# Patient Record
Sex: Female | Born: 1995 | Race: Black or African American | Hispanic: No | Marital: Single | Smoking: Never smoker
Health system: Southern US, Community
[De-identification: ages and names within clinical notes are randomized; demographics above are authoritative.]

## PROBLEM LIST (undated history)

## (undated) DIAGNOSIS — Z789 Other specified health status: Secondary | ICD-10-CM

---

## 2015-10-31 ENCOUNTER — Encounter (HOSPITAL_COMMUNITY): Payer: Self-pay | Admitting: Emergency Medicine

## 2015-10-31 ENCOUNTER — Emergency Department (HOSPITAL_COMMUNITY)
Admission: EM | Admit: 2015-10-31 | Discharge: 2015-11-01 | Disposition: A | Discharge status: Home / Self Care | Payer: Medicaid Other | Attending: Emergency Medicine | Admitting: Emergency Medicine

## 2015-10-31 ENCOUNTER — Emergency Department (HOSPITAL_COMMUNITY): Payer: Medicaid Other

## 2015-10-31 DIAGNOSIS — Y998 Other external cause status: Secondary | ICD-10-CM | POA: Insufficient documentation

## 2015-10-31 DIAGNOSIS — Y9389 Activity, other specified: Secondary | ICD-10-CM | POA: Diagnosis not present

## 2015-10-31 DIAGNOSIS — Y92410 Unspecified street and highway as the place of occurrence of the external cause: Secondary | ICD-10-CM | POA: Insufficient documentation

## 2015-10-31 DIAGNOSIS — F17200 Nicotine dependence, unspecified, uncomplicated: Secondary | ICD-10-CM | POA: Diagnosis not present

## 2015-10-31 DIAGNOSIS — S29001A Unspecified injury of muscle and tendon of front wall of thorax, initial encounter: Secondary | ICD-10-CM | POA: Diagnosis not present

## 2015-10-31 MED ORDER — IBUPROFEN 600 MG PO TABS: 600.0000 mg | ORAL_TABLET | Freq: Four times a day (QID) | ORAL | Status: DC | PRN

## 2015-10-31 MED ORDER — IBUPROFEN 400 MG PO TABS
600.0000 mg | ORAL_TABLET | Freq: Once | ORAL | Status: AC
  Administered 2015-11-01: 600 mg via ORAL
  Filled 2015-10-31: qty 1
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
## 2017-04-14 ENCOUNTER — Encounter (HOSPITAL_COMMUNITY): Payer: Self-pay | Admitting: Emergency Medicine

## 2017-04-14 DIAGNOSIS — S0990XA Unspecified injury of head, initial encounter: Secondary | ICD-10-CM | POA: Diagnosis present

## 2017-04-14 DIAGNOSIS — Y92410 Unspecified street and highway as the place of occurrence of the external cause: Secondary | ICD-10-CM | POA: Diagnosis not present

## 2017-04-14 DIAGNOSIS — Z87891 Personal history of nicotine dependence: Secondary | ICD-10-CM | POA: Insufficient documentation

## 2017-04-14 DIAGNOSIS — Y939 Activity, unspecified: Secondary | ICD-10-CM | POA: Diagnosis not present

## 2017-04-14 DIAGNOSIS — Y999 Unspecified external cause status: Secondary | ICD-10-CM | POA: Diagnosis not present
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
## 2017-04-15 ENCOUNTER — Emergency Department (HOSPITAL_COMMUNITY)
Admission: EM | Admit: 2017-04-15 | Discharge: 2017-04-15 | Disposition: A | Discharge status: Home / Self Care | Payer: No Typology Code available for payment source | Attending: Emergency Medicine | Admitting: Emergency Medicine

## 2017-04-15 MED ORDER — IBUPROFEN 600 MG PO TABS: 600.0000 mg | ORAL_TABLET | Freq: Four times a day (QID) | ORAL | 0 refills | Status: DC | PRN
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
## 2017-04-15 NOTE — Discharge Instructions (Signed)
Return if any problems.
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
## 2017-04-15 NOTE — ED Notes (Signed)
Pt verbalized understanding of d/c instructions and has no further questions. Pt is stable, A&Ox4, VSS.  

## 2017-11-03 ENCOUNTER — Encounter: Payer: Medicaid Other | Admitting: Obstetrics and Gynecology

## 2017-11-12 ENCOUNTER — Ambulatory Visit (INDEPENDENT_AMBULATORY_CARE_PROVIDER_SITE_OTHER): Payer: Medicaid Other | Admitting: Certified Nurse Midwife

## 2017-11-12 ENCOUNTER — Encounter: Payer: Self-pay | Admitting: Certified Nurse Midwife

## 2017-11-12 ENCOUNTER — Other Ambulatory Visit (HOSPITAL_COMMUNITY)
Admission: RE | Admit: 2017-11-12 | Discharge: 2017-11-12 | Disposition: A | Discharge status: Home / Self Care | Payer: Medicaid Other | Source: Ambulatory Visit | Attending: Certified Nurse Midwife | Admitting: Certified Nurse Midwife

## 2017-11-12 VITALS — BP 126/88 | HR 69 | Wt 222.8 lb

## 2017-11-12 DIAGNOSIS — Z3400 Encounter for supervision of normal first pregnancy, unspecified trimester: Secondary | ICD-10-CM | POA: Insufficient documentation

## 2017-11-12 DIAGNOSIS — N870 Mild cervical dysplasia: Secondary | ICD-10-CM | POA: Diagnosis not present

## 2017-11-12 DIAGNOSIS — Z3402 Encounter for supervision of normal first pregnancy, second trimester: Secondary | ICD-10-CM | POA: Diagnosis not present

## 2017-11-12 DIAGNOSIS — O219 Vomiting of pregnancy, unspecified: Secondary | ICD-10-CM

## 2017-11-12 MED ORDER — PRENATE PIXIE 10-0.6-0.4-200 MG PO CAPS: 1.0000 | ORAL_CAPSULE | Freq: Every day | ORAL | 12 refills | Status: DC

## 2017-11-12 MED ORDER — DOXYLAMINE-PYRIDOXINE 10-10 MG PO TBEC: DELAYED_RELEASE_TABLET | ORAL | 4 refills | Status: DC

## 2017-11-12 NOTE — Addendum Note (Signed)
Addended by: Orvilla CornwallENNEY, Emmit Oriley A on: 11/12/2017 11:10 AM   Modules accepted: Orders

## 2017-11-12 NOTE — Progress Notes (Signed)
Patient is in the office for initial ob visit. This was an unplanned pregnancy, fob is here and involved but they are currently not in a relationship.

## 2017-11-12 NOTE — Progress Notes (Signed)
Subjective:   Debra Reilly is a 21 y.o. G1P0 at 5937w2d by LMP being seen today for her first obstetrical visit.  Her obstetrical history is significant for obesity. Patient does intend to breast feed. Pregnancy history fully reviewed.  Reports N&V.  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   Patient reports no complaints.  HISTORY: Obstetric History   G1   P0   T0   P0   A0   L0    SAB0   TAB0   Ectopic0   Multiple0   Live Births0     # Outcome Date GA Lbr Len/2nd Weight Sex Delivery Anes PTL Lv  1 Current              History reviewed. No pertinent past medical history. History reviewed. No pertinent surgical history. Family History  Problem Relation Age of Onset  . Diabetes Maternal Aunt   . Hypertension Paternal Uncle    Social History   Tobacco Use  . Smoking status: Former Games developermoker  . Smokeless tobacco: Never Used  Substance Use Topics  . Alcohol use: No    Frequency: Never  . Drug use: No   No Known Allergies Current Outpatient Medications on File Prior to Visit  Medication Sig Dispense Refill  . prenatal vitamin w/FE, FA (PRENATAL 1 + 1) 27-1 MG TABS tablet Take 1 tablet by mouth daily at 12 noon.     No current facility-administered medications on file prior to visit.      Exam   Vitals:   11/12/17 1015  BP: 126/88  Pulse: 69  Weight: 222 lb 12.8 oz (101.1 kg)   Fetal Heart Rate (bpm): 165; doppler  Uterus:     Pelvic Exam: Perineum: no hemorrhoids, normal perineum   Vulva: normal external genitalia, no lesions   Vagina:  normal mucosa, normal discharge   Cervix: no lesions and normal, pap smear done.    Adnexa: normal adnexa and no mass, fullness, tenderness   Bony Pelvis: average  System: General: well-developed, well-nourished female in no acute distress   Breast:  normal appearance, no masses or tenderness   Skin: normal coloration and turgor, no rashes   Neurologic: oriented, normal, negative, normal mood   Extremities: normal strength, tone, and  muscle mass, ROM of all joints is normal   HEENT PERRLA, extraocular movement intact and sclera clear, anicteric   Mouth/Teeth mucous membranes moist, pharynx normal without lesions and dental hygiene good   Neck supple and no masses   Cardiovascular: regular rate and rhythm   Respiratory:  no respiratory distress, normal breath sounds   Abdomen: soft, non-tender; bowel sounds normal; no masses,  no organomegaly     Assessment:   Pregnancy: G1P0 Patient Active Problem List   Diagnosis Date Noted  . Supervision of normal first pregnancy, antepartum 11/12/2017     Plan:  1. Supervision of normal first pregnancy, antepartum      - Obstetric Panel, Including HIV - Hemoglobinopathy evaluation - Culture, OB Urine - Cervicovaginal ancillary only - Cytology - PAP - Flu Vaccine QUAD 36+ mos IM (Fluarix, Quad PF) - Inheritest Core(CF97,SMA,FraX) - Prenat-FeAsp-Meth-FA-DHA w/o A (PRENATE PIXIE) 10-0.6-0.4-200 MG CAPS; Take 1 tablet by mouth daily.  Dispense: 30 capsule; Refill: 12 - MaterniT21 PLUS Core+SCA - US MFM OB DETAIL +14 WK; Future  2. Nausea/vomiting in pregnancy     - Doxylamine-Pyridoxine (DICLEGIS) 10-10 MG TBEC; Take 1 tablet with breakfast and lunch.  Take 2 tablets at bedtime.  Dispense: 100 tablet; Refill: 4  Initial labs drawn. Continue prenatal vitamins. Genetic Screening discussed, NIPS: ordered. Ultrasound discussed; fetal anatomic survey: ordered. Problem list reviewed and updated. The nature of [facility] with multiple MDs and other Advanced Practice Providers was explained to patient; also emphasized that residents, students are part of our team. Routine obstetric precautions reviewed. Return in about 4 weeks (around 12/10/2017) for ROB.     Orvilla Cornwallachelle Wen Munford, CNM [facility]

## 2017-11-13 LAB — CERVICOVAGINAL ANCILLARY ONLY
Bacterial vaginitis: NEGATIVE
CANDIDA VAGINITIS: NEGATIVE
Chlamydia: NEGATIVE
Neisseria Gonorrhea: NEGATIVE
TRICH (WINDOWPATH): NEGATIVE

## 2017-11-14 LAB — OBSTETRIC PANEL, INCLUDING HIV
ANTIBODY SCREEN: NEGATIVE
BASOS ABS: 0 10*3/uL (ref 0.0–0.2)
BASOS: 0 %
EOS (ABSOLUTE): 0 10*3/uL (ref 0.0–0.4)
EOS: 1 %
HEMATOCRIT: 35.9 % (ref 34.0–46.6)
HEP B S AG: NEGATIVE
HIV SCREEN 4TH GENERATION: NONREACTIVE
Hemoglobin: 11.5 g/dL (ref 11.1–15.9)
Immature Grans (Abs): 0 10*3/uL (ref 0.0–0.1)
Immature Granulocytes: 0 %
Lymphocytes Absolute: 2 10*3/uL (ref 0.7–3.1)
Lymphs: 24 %
MCH: 29.7 pg (ref 26.6–33.0)
MCHC: 32 g/dL (ref 31.5–35.7)
MCV: 93 fL (ref 79–97)
Monocytes Absolute: 0.5 10*3/uL (ref 0.1–0.9)
Monocytes: 6 %
NEUTROS ABS: 5.7 10*3/uL (ref 1.4–7.0)
Neutrophils: 69 %
Platelets: 265 10*3/uL (ref 150–379)
RBC: 3.87 x10E6/uL (ref 3.77–5.28)
RDW: 15.2 % (ref 12.3–15.4)
RH TYPE: POSITIVE
RPR: NONREACTIVE
Rubella Antibodies, IGG: 1.76 index (ref >0.99)
WBC: 8.3 10*3/uL (ref 3.4–10.8)

## 2017-11-14 LAB — HEMOGLOBIN A1C
Est. average glucose Bld gHb Est-mCnc: 111 mg/dL
HEMOGLOBIN A1C: 5.5 % (ref 4.8–5.6)

## 2017-11-14 LAB — URINE CULTURE, OB REFLEX

## 2017-11-14 LAB — HEMOGLOBINOPATHY EVALUATION
HEMOGLOBIN A2 QUANTITATION: 2.5 % (ref 1.8–3.2)
HGB A: 97.5 % (ref 96.4–98.8)
HGB C: 0 %
HGB S: 0 %
HGB VARIANT: 0 %
Hemoglobin F Quantitation: 0 % (ref 0.0–2.0)

## 2017-11-14 LAB — CULTURE, OB URINE

## 2017-11-17 LAB — MATERNIT21 PLUS CORE+SCA
CHROMOSOME 13: NEGATIVE
CHROMOSOME 21: NEGATIVE
Chromosome 18: NEGATIVE
Y CHROMOSOME: NOT DETECTED

## 2017-11-19 LAB — CYTOLOGY - PAP

## 2017-11-20 ENCOUNTER — Encounter: Payer: Self-pay | Admitting: Family Medicine

## 2017-11-20 DIAGNOSIS — Z3400 Encounter for supervision of normal first pregnancy, unspecified trimester: Secondary | ICD-10-CM

## 2017-11-20 DIAGNOSIS — E669 Obesity, unspecified: Secondary | ICD-10-CM | POA: Insufficient documentation

## 2017-11-20 DIAGNOSIS — R87612 Low grade squamous intraepithelial lesion on cytologic smear of cervix (LGSIL): Secondary | ICD-10-CM | POA: Insufficient documentation

## 2017-11-20 DIAGNOSIS — O99210 Obesity complicating pregnancy, unspecified trimester: Secondary | ICD-10-CM | POA: Insufficient documentation

## 2017-11-20 DIAGNOSIS — O3440 Maternal care for other abnormalities of cervix, unspecified trimester: Secondary | ICD-10-CM

## 2017-11-20 DIAGNOSIS — R87619 Unspecified abnormal cytological findings in specimens from cervix uteri: Secondary | ICD-10-CM

## 2017-11-20 NOTE — Progress Notes (Signed)
Reviewed prenatal chart. Patient needs repeat pap in 1 year

## 2017-11-25 LAB — INHERITEST CORE(CF97,SMA,FRAX)

## 2017-11-25 NOTE — L&D Delivery Note (Signed)
Patient is a 22 y.o. now G1P1 s/p NSVD at 646w6d, who was admitted for PROM @2015  on 05/16/18.  She progressed with augmentation (pitocin) to complete and pushed 35 minutes to deliver.  Cord clamping delayed by several minutes then clamped by CNM and cut by FOB.  Placenta intact and spontaneous, bleeding minimal.  Left labial laceration identified- not repaired, hemostatic.  Mom and baby stable prior to transfer to postpartum. She plans on breastfeeding. She is unsure of method for birth control.  Delivery Note At 5:50 AM a viable and healthy female was delivered via Vaginal, Spontaneous (Presentation: LOA).  APGAR: 8, 9; weight pending.   Placenta intact and spontaneous, bleeding minimal. 3V Cord:  with n complications.   Anesthesia: Epidural Episiotomy: None Lacerations: Labial Suture Repair: None Est. Blood Loss (mL): 50  Mom to postpartum.  Baby to Couplet care / Skin to Skin.  Sharyon CableVeronica C Terie Lear CNM 05/17/2018, 6:16 AM

## 2017-12-01 ENCOUNTER — Other Ambulatory Visit: Payer: Self-pay | Admitting: Certified Nurse Midwife

## 2017-12-01 DIAGNOSIS — Z3400 Encounter for supervision of normal first pregnancy, unspecified trimester: Secondary | ICD-10-CM

## 2017-12-10 ENCOUNTER — Ambulatory Visit (INDEPENDENT_AMBULATORY_CARE_PROVIDER_SITE_OTHER): Payer: Medicaid Other | Admitting: Certified Nurse Midwife

## 2017-12-10 ENCOUNTER — Encounter: Payer: Self-pay | Admitting: Certified Nurse Midwife

## 2017-12-10 VITALS — BP 117/71 | HR 72 | Wt 225.6 lb

## 2017-12-10 DIAGNOSIS — E669 Obesity, unspecified: Secondary | ICD-10-CM

## 2017-12-10 DIAGNOSIS — R87612 Low grade squamous intraepithelial lesion on cytologic smear of cervix (LGSIL): Secondary | ICD-10-CM

## 2017-12-10 DIAGNOSIS — Z3402 Encounter for supervision of normal first pregnancy, second trimester: Secondary | ICD-10-CM

## 2017-12-10 DIAGNOSIS — Z3400 Encounter for supervision of normal first pregnancy, unspecified trimester: Secondary | ICD-10-CM

## 2017-12-10 DIAGNOSIS — O99212 Obesity complicating pregnancy, second trimester: Secondary | ICD-10-CM

## 2017-12-10 DIAGNOSIS — O99210 Obesity complicating pregnancy, unspecified trimester: Secondary | ICD-10-CM

## 2017-12-10 NOTE — Progress Notes (Signed)
   PRENATAL VISIT NOTE  Subjective:  Debra Reilly is a 22 y.o. G1P0 at 6456w2d being seen today for ongoing prenatal care.  She is currently monitored for the following issues for this low-risk pregnancy and has Supervision of normal first pregnancy, antepartum; Obesity affecting pregnancy, antepartum; Obesity (BMI 30.0-34.9); Abnormal cervical Papanicolaou smear affecting pregnancy, antepartum; and LGSIL on Pap smear of cervix on their problem list.  Patient reports no complaints.  Contractions: Not present. Vag. Bleeding: None.   . Denies leaking of fluid.   The following portions of the patient's history were reviewed and updated as appropriate: allergies, current medications, past family history, past medical history, past social history, past surgical history and problem list. Problem list updated.  Objective:   Vitals:   12/10/17 1014  BP: 117/71  Pulse: 72  Weight: 225 lb 9.6 oz (102.3 kg)    Fetal Status: Fetal Heart Rate (bpm): 160; doppler         General:  Alert, oriented and cooperative. Patient is in no acute distress.  Skin: Skin is warm and dry. No rash noted.   Cardiovascular: Normal heart rate noted  Respiratory: Normal respiratory effort, no problems with respiration noted  Abdomen: Soft, gravid, appropriate for gestational age.  Pain/Pressure: Absent     Pelvic: Cervical exam deferred        Extremities: Normal range of motion.     Mental Status:  Normal mood and affect. Normal behavior. Normal judgment and thought content.   Assessment and Plan:  Pregnancy: G1P0 at 556w2d  1. Supervision of normal first pregnancy, antepartum     Doing well.  Prenatal class lists given.  - AFP, Serum, Open Spina Bifida  2. Obesity affecting pregnancy, antepartum     Has lost 4 lbs this pregnancy  3. LGSIL on Pap smear of cervix     Colposcopy PP  Preterm labor symptoms and general obstetric precautions including but not limited to vaginal bleeding, contractions, leaking of  fluid and fetal movement were reviewed in detail with the patient. Please refer to After Visit Summary for other counseling recommendations.  Return in about 4 weeks (around 01/07/2018) for ROB.   Roe Coombsachelle A Afton Lavalle, CNM

## 2017-12-10 NOTE — Progress Notes (Signed)
Pt denies concerns at this time. 

## 2017-12-13 LAB — AFP, SERUM, OPEN SPINA BIFIDA
AFP MOM: 0.92
AFP VALUE AFPOSL: 26.3 ng/mL
Gest. Age on Collection Date: 16.3 weeks
MATERNAL AGE AT EDD: 22 a
OSBR Risk 1 IN: 10000
TEST RESULTS AFP: NEGATIVE
Weight: 226 [lb_av]

## 2017-12-17 ENCOUNTER — Other Ambulatory Visit: Payer: Self-pay | Admitting: Certified Nurse Midwife

## 2017-12-17 DIAGNOSIS — Z3400 Encounter for supervision of normal first pregnancy, unspecified trimester: Secondary | ICD-10-CM

## 2017-12-19 ENCOUNTER — Encounter (HOSPITAL_COMMUNITY): Payer: Self-pay | Admitting: Certified Nurse Midwife

## 2017-12-29 ENCOUNTER — Ambulatory Visit (HOSPITAL_COMMUNITY)
Admission: RE | Admit: 2017-12-29 | Discharge: 2017-12-29 | Disposition: A | Discharge status: Home / Self Care | Payer: Medicaid Other | Source: Ambulatory Visit | Attending: Certified Nurse Midwife | Admitting: Certified Nurse Midwife

## 2017-12-29 DIAGNOSIS — O99212 Obesity complicating pregnancy, second trimester: Secondary | ICD-10-CM | POA: Diagnosis not present

## 2017-12-29 DIAGNOSIS — Z3689 Encounter for other specified antenatal screening: Secondary | ICD-10-CM | POA: Diagnosis present

## 2017-12-29 DIAGNOSIS — Z3400 Encounter for supervision of normal first pregnancy, unspecified trimester: Secondary | ICD-10-CM

## 2017-12-29 DIAGNOSIS — Z3A19 19 weeks gestation of pregnancy: Secondary | ICD-10-CM | POA: Diagnosis not present

## 2017-12-30 ENCOUNTER — Other Ambulatory Visit: Payer: Self-pay | Admitting: Certified Nurse Midwife

## 2017-12-30 DIAGNOSIS — Z3400 Encounter for supervision of normal first pregnancy, unspecified trimester: Secondary | ICD-10-CM

## 2018-01-07 ENCOUNTER — Encounter: Payer: Medicaid Other | Admitting: Certified Nurse Midwife

## 2018-01-12 ENCOUNTER — Ambulatory Visit (INDEPENDENT_AMBULATORY_CARE_PROVIDER_SITE_OTHER): Payer: Medicaid Other | Admitting: Certified Nurse Midwife

## 2018-01-12 ENCOUNTER — Encounter: Payer: Self-pay | Admitting: Certified Nurse Midwife

## 2018-01-12 DIAGNOSIS — Z3402 Encounter for supervision of normal first pregnancy, second trimester: Secondary | ICD-10-CM

## 2018-01-12 DIAGNOSIS — Z3400 Encounter for supervision of normal first pregnancy, unspecified trimester: Secondary | ICD-10-CM

## 2018-01-12 NOTE — Progress Notes (Signed)
   PRENATAL VISIT NOTE  Subjective:  Debra Reilly is a 22 y.o. G1P0 at 5927w0d being seen today for ongoing prenatal care.  She is currently monitored for the following issues for this low-risk pregnancy and has Supervision of normal first pregnancy, antepartum; Obesity affecting pregnancy, antepartum; Obesity (BMI 30.0-34.9); Abnormal cervical Papanicolaou smear affecting pregnancy, antepartum; and LGSIL on Pap smear of cervix on their problem list.  Patient reports no bleeding, no contractions, no cramping and no leaking.  Contractions: Not present. Vag. Bleeding: None.   . Denies leaking of fluid.   The following portions of the patient's history were reviewed and updated as appropriate: allergies, current medications, past family history, past medical history, past social history, past surgical history and problem list. Problem list updated.  Objective:   Vitals:   01/12/18 1017  BP: 125/86  Pulse: 92  Weight: 224 lb (101.6 kg)    Fetal Status: Fetal Heart Rate (bpm): 146-152; doppler Fundal Height: 22 cm       General:  Alert, oriented and cooperative. Patient is in no acute distress.  Skin: Skin is warm and dry. No rash noted.   Cardiovascular: Normal heart rate noted  Respiratory: Normal respiratory effort, no problems with respiration noted  Abdomen: Soft, gravid, appropriate for gestational age.  Pain/Pressure: Present     Pelvic: Cervical exam deferred        Extremities: Normal range of motion.  Edema: None  Mental Status:  Normal mood and affect. Normal behavior. Normal judgment and thought content.   Assessment and Plan:  Pregnancy: G1P0 at 1227w0d  1. Supervision of normal first pregnancy, antepartum     Doing well.  Has lost 6 lbs this pregnancy, will consider f/u growth US around start of 3rd trimester.   Preterm labor symptoms and general obstetric precautions including but not limited to vaginal bleeding, contractions, leaking of fluid and fetal movement were  reviewed in detail with the patient. Please refer to After Visit Summary for other counseling recommendations.  Return in about 4 weeks (around 02/09/2018) for ROB.   Roe Coombsachelle A Iktan Aikman, CNM

## 2018-01-22 ENCOUNTER — Ambulatory Visit (INDEPENDENT_AMBULATORY_CARE_PROVIDER_SITE_OTHER): Payer: Medicaid Other | Admitting: Certified Nurse Midwife

## 2018-01-22 ENCOUNTER — Encounter: Payer: Self-pay | Admitting: Certified Nurse Midwife

## 2018-01-22 VITALS — BP 114/76 | HR 81 | Wt 230.4 lb

## 2018-01-22 DIAGNOSIS — Z3400 Encounter for supervision of normal first pregnancy, unspecified trimester: Secondary | ICD-10-CM

## 2018-01-22 DIAGNOSIS — E669 Obesity, unspecified: Secondary | ICD-10-CM

## 2018-01-22 DIAGNOSIS — O99210 Obesity complicating pregnancy, unspecified trimester: Secondary | ICD-10-CM

## 2018-01-22 DIAGNOSIS — Z3402 Encounter for supervision of normal first pregnancy, second trimester: Secondary | ICD-10-CM

## 2018-01-22 DIAGNOSIS — O99212 Obesity complicating pregnancy, second trimester: Secondary | ICD-10-CM

## 2018-01-22 NOTE — Progress Notes (Signed)
   PRENATAL VISIT NOTE  Subjective:  Debra Reilly is a 22 y.o. G1P0 at 7557w3d being seen today for ongoing prenatal care.  She is currently monitored for the following issues for this low-risk pregnancy and has Supervision of normal first pregnancy, antepartum; Obesity affecting pregnancy, antepartum; Obesity (BMI 30.0-34.9); Abnormal cervical Papanicolaou smear affecting pregnancy, antepartum; and LGSIL on Pap smear of cervix on their problem list.  Patient reports backache, no bleeding, no contractions, no cramping, no leaking and lower left hip pain with movement: round ligament type pain.  Contractions: Not present. Vag. Bleeding: None.  Movement: Present. Denies leaking of fluid.   The following portions of the patient's history were reviewed and updated as appropriate: allergies, current medications, past family history, past medical history, past social history, past surgical history and problem list. Problem list updated.  Objective:   Vitals:   01/22/18 1014  BP: 114/76  Pulse: 81  Weight: 230 lb 6.4 oz (104.5 kg)    Fetal Status: Fetal Heart Rate (bpm): 150; doppler Fundal Height: 22 cm Movement: Present     General:  Alert, oriented and cooperative. Patient is in no acute distress.  Skin: Skin is warm and dry. No rash noted.   Cardiovascular: Normal heart rate noted  Respiratory: Normal respiratory effort, no problems with respiration noted  Abdomen: Soft, gravid, appropriate for gestational age.  Pain/Pressure: Absent     Pelvic: Cervical exam deferred        Extremities: Normal range of motion.  Edema: None  Mental Status:  Normal mood and affect. Normal behavior. Normal judgment and thought content.   Assessment and Plan:  Pregnancy: G1P0 at 3557w3d  1. Supervision of normal first pregnancy, antepartum     Round ligament pain of pregnancy.  Homeopathic remedies discussed.  Declines maternity support belt.   2. Obesity affecting pregnancy, antepartum     6 lb weight  gain this pregnancy  3. Obesity (BMI 30.0-34.9)      Preterm labor symptoms and general obstetric precautions including but not limited to vaginal bleeding, contractions, leaking of fluid and fetal movement were reviewed in detail with the patient. Please refer to After Visit Summary for other counseling recommendations.  Return in about 4 weeks (around 02/19/2018) for ROB, 2 hr OGTT.   Roe Coombsachelle A Denney, CNM

## 2018-01-22 NOTE — Progress Notes (Signed)
Pt complains of having a sharp pain lower left abdominal area. She has not taken anything for the pain.

## 2018-01-22 NOTE — Patient Instructions (Addendum)
Glucose Tolerance Test During Pregnancy The glucose tolerance test (GTT) is a blood test used to determine if you have developed a type of diabetes during pregnancy (gestational diabetes). This is when your body does not properly process sugar (glucose) in the food you eat, resulting in high blood glucose levels. Typically, a GTT is done after you have had a 1-hour glucose test with results that indicate you possibly have gestational diabetes. It may also be done if:  You have a history of giving birth to very large babies or have experienced repeated fetal loss (stillbirth).  You have signs and symptoms of diabetes, such as: ? Changes in your vision. ? Tingling or numbness in your hands or feet. ? Changes in hunger, thirst, and urination not otherwise explained by your pregnancy.  The GTT lasts about 3 hours. You will be given a sugar-water solution to drink at the beginning of the test. You will have blood drawn before you drink the solution and then again 1, 2, and 3 hours after you drink it. You will not be allowed to eat or drink anything else during the test. You must remain at the testing location to make sure that your blood is drawn on time. You should also avoid exercising during the test, because exercise can alter test results. How do I prepare for this test? Eat normally for 3 days prior to the GTT test, including having plenty of carbohydrate-rich foods. Do not eat or drink anything except water during the final 12 hours before the test. In addition, your health care provider may ask you to stop taking certain medicines before the test. What do the results mean? It is your responsibility to obtain your test results. Ask the lab or department performing the test when and how you will get your results. Contact your health care provider to discuss any questions you have about your results. Range of Normal Values Ranges for normal values may vary among different labs and hospitals. You  should always check with your health care provider after having lab work or other tests done to discuss whether your values are considered within normal limits. Normal levels of blood glucose are as follows:  Fasting: less than 105 mg/dL.  1 hour after drinking the solution: less than 190 mg/dL.  2 hours after drinking the solution: less than 165 mg/dL.  3 hours after drinking the solution: less than 145 mg/dL.  Some substances can interfere with GTT results. These may include:  Blood pressure and heart failure medicines, including beta blockers, furosemide, and thiazides.  Anti-inflammatory medicines, including aspirin.  Nicotine.  Some psychiatric medicines.  Meaning of Results Outside Normal Value Ranges GTT test results that are above normal values may indicate a number of health problems, such as:  Gestational diabetes.  Acute stress response.  Cushing syndrome.  Tumors such as pheochromocytoma or glucagonoma.  Long-term kidney problems.  Pancreatitis.  Hyperthyroidism.  Current infection.  Discuss your test results with your health care provider. He or she will use the results to make a diagnosis and determine a treatment plan that is right for you. This information is not intended to replace advice given to you by your health care provider. Make sure you discuss any questions you have with your health care provider. Document Released: 05/12/2012 Document Revised: 04/18/2016 Document Reviewed: 03/18/2014 Elsevier Interactive Patient Education  2018 [employer]  Second Trimester of Pregnancy The second trimester is from week 13 through week 28, month 4 through 6. This is  often the time in pregnancy that you feel your best. Often times, morning sickness has lessened or quit. You may have more energy, and you may get hungry more often. Your unborn baby (fetus) is growing rapidly. At the end of the sixth month, he or she is about 9 inches long and weighs about 1  pounds. You will likely feel the baby move (quickening) between 18 and 20 weeks of pregnancy. Follow these instructions at home:  Avoid all smoking, herbs, and alcohol. Avoid drugs not approved by your doctor.  Do not use any tobacco products, including cigarettes, chewing tobacco, and electronic cigarettes. If you need help quitting, ask your doctor. You may get counseling or other support to help you quit.  Only take medicine as told by your doctor. Some medicines are safe and some are not during pregnancy.  Exercise only as told by your doctor. Stop exercising if you start having cramps.  Eat regular, healthy meals.  Wear a good support bra if your breasts are tender.  Do not use hot tubs, steam rooms, or saunas.  Wear your seat belt when driving.  Avoid raw meat, uncooked cheese, and liter boxes and soil used by cats.  Take your prenatal vitamins.  Take 1500-2000 milligrams of calcium daily starting at the 20th week of pregnancy until you deliver your baby.  Try taking medicine that helps you poop (stool softener) as needed, and if your doctor approves. Eat more fiber by eating fresh fruit, vegetables, and whole grains. Drink enough fluids to keep your pee (urine) clear or pale yellow.  Take warm water baths (sitz baths) to soothe pain or discomfort caused by hemorrhoids. Use hemorrhoid cream if your doctor approves.  If you have puffy, bulging veins (varicose veins), wear support hose. Raise (elevate) your feet for 15 minutes, 3-4 times a day. Limit salt in your diet.  Avoid heavy lifting, wear low heals, and sit up straight.  Rest with your legs raised if you have leg cramps or low back pain.  Visit your dentist if you have not gone during your pregnancy. Use a soft toothbrush to brush your teeth. Be gentle when you floss.  You can have sex (intercourse) unless your doctor tells you not to.  Go to your doctor visits. Get help if:  You feel dizzy.  You have mild  cramps or pressure in your lower belly (abdomen).  You have a nagging pain in your belly area.  You continue to feel sick to your stomach (nauseous), throw up (vomit), or have watery poop (diarrhea).  You have bad smelling fluid coming from your vagina.  You have pain with peeing (urination). Get help right away if:  You have a fever.  You are leaking fluid from your vagina.  You have spotting or bleeding from your vagina.  You have severe belly cramping or pain.  You lose or gain weight rapidly.  You have trouble catching your breath and have chest pain.  You notice sudden or extreme puffiness (swelling) of your face, hands, ankles, feet, or legs.  You have not felt the baby move in over an hour.  You have severe headaches that do not go away with medicine.  You have vision changes. This information is not intended to replace advice given to you by your health care provider. Make sure you discuss any questions you have with your health care provider. Document Released: 02/05/2010 Document Revised: 04/18/2016 Document Reviewed: 01/12/2013 Elsevier Interactive Patient Education  2017 [employer].  AREA PEDIATRIC/FAMILY PRACTICE PHYSICIANS  [facility] [address] [place], [place]  [address] Phone - [phone]   Fax - [phone]  [facility] [address] [place], [place] [address] Phone - [phone]   Fax - [phone]  JACK AMOS [address] [place], [place]  [address] Phone - [phone]   Fax - [phone]  [facility] [address] [place], [place]  [address] Phone - [phone]   Fax - [phone]  [facility] [address] [place], [place]  [address] Phone - [phone]   Fax - [phone]  [facility] [address], [address] [place], [place]  [address] Phone - [phone]   Fax - [phone]  [facility]  [place] [address], [address] [place], [place]  [address] Phone - [phone]   Fax - [phone]  [facility] [address] [place], [place]  [address] Phone - [phone]   Fax - [phone]  [facility] [address] [place], [place]  [address] Phone - [phone]   Fax - [phone] [facility] [place] [place] [address] [place], [place]  [address] Phone - [phone]   Fax - [phone]  [facility] [address] [place], [place]  [address] Phone - [phone]   Fax - [phone]  [facility] [address], [address] [place], [place]  [address] Phone - [phone]   Fax - [phone]  [facility] [place] [address] [place], [place]  [address] Phone - [phone]   Fax - [phone]  [facility] [address], [address] [place], [place]  [address] Phone - [phone]  [facility] [address] [place], [place]  [address] Phone - [phone]   Fax - [phone]  [facility] [address], [address] [place], [place]  [address] Phone - [phone]   Fax - [phone]  [facility] [address] [place], [place]  [address] Phone - [phone]   Fax - [phone]  [facility] [address] [place], [place]  [address] Phone - [phone]   Fax - [phone]   [facility] [address] [place], [place]  [address] Phone - [phone]   Fax - [phone]  [facility] [address], [address] [place], [place]  [address] Phone - [phone]   Fax - [phone]  DAVID RUBIN [address], [address] [place], [place]  [address] Phone - [phone]   Fax - [phone]  [facility] [address] [place], [place]  [address] Phone - [phone]    Fax - [phone]  [facility] [address] [place], [place]  [address] Phone - [phone]   Fax - [phone] [facility] [place] [address] [place], [place]  [address] Phone - [phone]   Fax - [phone]  [facility] [place] [address] [place], [place]  [address] Phone - [phone]   Fax - [phone]  [facility] [place] [address] [place], [place]  [address] Phone - [phone]   Fax - [phone]  [facility] [place] Wyvonne Lenzharlene Flemming MD [address] [place] [address] Phone [phone]  Fax [phone]

## 2018-02-09 ENCOUNTER — Encounter: Payer: Medicaid Other | Admitting: Certified Nurse Midwife

## 2018-02-19 ENCOUNTER — Encounter: Payer: Self-pay | Admitting: Certified Nurse Midwife

## 2018-02-19 ENCOUNTER — Other Ambulatory Visit: Payer: Self-pay

## 2018-02-19 ENCOUNTER — Ambulatory Visit (INDEPENDENT_AMBULATORY_CARE_PROVIDER_SITE_OTHER): Payer: Medicaid Other | Admitting: Certified Nurse Midwife

## 2018-02-19 VITALS — BP 113/74 | HR 80 | Wt 236.6 lb

## 2018-02-19 DIAGNOSIS — E669 Obesity, unspecified: Secondary | ICD-10-CM

## 2018-02-19 DIAGNOSIS — Z3400 Encounter for supervision of normal first pregnancy, unspecified trimester: Secondary | ICD-10-CM

## 2018-02-19 DIAGNOSIS — O99210 Obesity complicating pregnancy, unspecified trimester: Secondary | ICD-10-CM

## 2018-02-19 NOTE — Progress Notes (Signed)
   PRENATAL VISIT NOTE  Subjective:  Debra Reilly is a 22 y.o. G1P0 at 4011w3d being seen today for ongoing prenatal care.  She is currently monitored for the following issues for this low-risk pregnancy and has Supervision of normal first pregnancy, antepartum; Obesity affecting pregnancy, antepartum; Obesity (BMI 30.0-34.9); Abnormal cervical Papanicolaou smear affecting pregnancy, antepartum; and LGSIL on Pap smear of cervix on their problem list.  Patient reports no bleeding, no contractions, no cramping, no leaking and upper left abdominal pain: non-emergent, comes and goes, no fever, normal bowel movements, average diet.  Contractions: Not present. Vag. Bleeding: None.  Movement: Present. Denies leaking of fluid.   The following portions of the patient's history were reviewed and updated as appropriate: allergies, current medications, past family history, past medical history, past social history, past surgical history and problem list. Problem list updated.  Objective:   Vitals:   02/19/18 1137  BP: 113/74  Pulse: 80  Weight: 236 lb 9.6 oz (107.3 kg)    Fetal Status:     Movement: Present     General:  Alert, oriented and cooperative. Patient is in no acute distress.  Skin: Skin is warm and dry. No rash noted.   Cardiovascular: Normal heart rate noted  Respiratory: Normal respiratory effort, no problems with respiration noted  Abdomen: Soft, gravid, appropriate for gestational age.  Pain/Pressure: Present     Pelvic: Cervical exam deferred        Extremities: Normal range of motion.  Edema: None  Mental Status:  Normal mood and affect. Normal behavior. Normal judgment and thought content.   Assessment and Plan:  Pregnancy: G1P0 at 5611w3d  1. Supervision of normal first pregnancy, antepartum     Upper left abdominal pain, non-specific/non-acute.  F/U precautions reviewed.  2. Obesity (BMI 30.0-34.9)     6 lb weight gain  3. Obesity affecting pregnancy, antepartum       Preterm labor symptoms and general obstetric precautions including but not limited to vaginal bleeding, contractions, leaking of fluid and fetal movement were reviewed in detail with the patient. Please refer to After Visit Summary for other counseling recommendations.  Return in about 2 weeks (around 03/05/2018) for ROB, 2 hr OGTT.   Roe Coombsachelle A Jahid Weida, CNM

## 2018-02-19 NOTE — Progress Notes (Signed)
C/o upper left flank pain

## 2018-03-05 ENCOUNTER — Other Ambulatory Visit: Payer: Medicaid Other

## 2018-03-05 ENCOUNTER — Encounter: Payer: Self-pay | Admitting: Certified Nurse Midwife

## 2018-03-05 ENCOUNTER — Ambulatory Visit (INDEPENDENT_AMBULATORY_CARE_PROVIDER_SITE_OTHER): Payer: Medicaid Other | Admitting: Certified Nurse Midwife

## 2018-03-05 VITALS — BP 113/76 | HR 88 | Temp 97.5°F | Wt 239.8 lb

## 2018-03-05 DIAGNOSIS — O3440 Maternal care for other abnormalities of cervix, unspecified trimester: Secondary | ICD-10-CM

## 2018-03-05 DIAGNOSIS — O99213 Obesity complicating pregnancy, third trimester: Secondary | ICD-10-CM

## 2018-03-05 DIAGNOSIS — Z3400 Encounter for supervision of normal first pregnancy, unspecified trimester: Secondary | ICD-10-CM

## 2018-03-05 DIAGNOSIS — Z3493 Encounter for supervision of normal pregnancy, unspecified, third trimester: Secondary | ICD-10-CM

## 2018-03-05 DIAGNOSIS — O99210 Obesity complicating pregnancy, unspecified trimester: Secondary | ICD-10-CM

## 2018-03-05 DIAGNOSIS — Z3403 Encounter for supervision of normal first pregnancy, third trimester: Secondary | ICD-10-CM

## 2018-03-05 DIAGNOSIS — R87619 Unspecified abnormal cytological findings in specimens from cervix uteri: Secondary | ICD-10-CM

## 2018-03-05 DIAGNOSIS — O3443 Maternal care for other abnormalities of cervix, third trimester: Secondary | ICD-10-CM

## 2018-03-05 NOTE — Progress Notes (Signed)
   PRENATAL VISIT NOTE  Subjective:  Debra Reilly is a 22 y.o. G1P0 at 6823w3d being seen today for ongoing prenatal care.  She is currently monitored for the following issues for this low-risk pregnancy and has Supervision of normal first pregnancy, antepartum; Obesity affecting pregnancy, antepartum; Obesity (BMI 30.0-34.9); Abnormal cervical Papanicolaou smear affecting pregnancy, antepartum; and LGSIL on Pap smear of cervix on their problem list.  Patient reports no complaints.  Contractions: Not present. Vag. Bleeding: None.  Movement: Present. Denies leaking of fluid.   The following portions of the patient's history were reviewed and updated as appropriate: allergies, current medications, past family history, past medical history, past social history, past surgical history and problem list. Problem list updated.  Objective:   Vitals:   03/05/18 0917  BP: 113/76  Pulse: 88  Temp: (!) 97.5 F (36.4 C)  Weight: 239 lb 12.8 oz (108.8 kg)    Fetal Status:     Movement: Present     General:  Alert, oriented and cooperative. Patient is in no acute distress.  Skin: Skin is warm and dry. No rash noted.   Cardiovascular: Normal heart rate noted  Respiratory: Normal respiratory effort, no problems with respiration noted  Abdomen: Soft, gravid, appropriate for gestational age.  Pain/Pressure: Present     Pelvic: Cervical exam deferred        Extremities: Normal range of motion.  Edema: Trace  Mental Status: Normal mood and affect. Normal behavior. Normal judgment and thought content.   Assessment and Plan:  Pregnancy: G1P0 at 2223w3d  1. Supervision of normal first pregnancy, antepartum     Doing well. 2 hour OGTT today.   2. Obesity affecting pregnancy, antepartum     9 lb weight gain this pregnancy  3. Abnormal cervical Papanicolaou smear affecting pregnancy, antepartum     Repeat pap smear postpartum.  Preterm labor symptoms and general obstetric precautions including but not  limited to vaginal bleeding, contractions, leaking of fluid and fetal movement were reviewed in detail with the patient. Please refer to After Visit Summary for other counseling recommendations.  Return in about 2 weeks (around 03/19/2018) for ROB.  No future appointments.  Roe Coombsachelle A Kailin Leu, CNM

## 2018-03-05 NOTE — Patient Instructions (Signed)
Before Baby Comes Home Before your baby arrives it is important to:  Have all of the supplies that you will need to care for your baby.  Know where to go if there is an emergency.  Discuss the baby's arrival with other family members.  What supplies will I need?  It is recommended that you have the following supplies: Large Items  Crib.  Crib mattress.  Rear-facing infant car seat. If possible, have a trained professional check to make sure that it is installed correctly.  Feeding  6-8 bottles that are 4-5 oz in size.  6-8 nipples.  Bottle brush.  Sterilizer, or a large pan or kettle with a lid.  A way to boil and cool water.  If you will be breastfeeding: ? Breast pump. ? Nipple cream. ? Nursing bra. ? Breast pads. ? Breast shields.  If you will be formula feeding: ? Formula. ? Measuring cups. ? Measuring spoons.  Bathing  Mild baby soap and baby shampoo.  Petroleum jelly.  Soft cloth towel and washcloth.  Hooded towel.  Cotton balls.  Bath basin.  Other Supplies  Rectal thermometer.  Bulb syringe.  Baby wipes or washcloths for diaper changes.  Diaper bag.  Changing pad.  Clothing, including one-piece outfits and pajamas.  Baby nail clippers.  Receiving blankets.  Mattress pad and sheets for the crib.  Night-light for the baby's room.  Baby monitor.  2 or 3 pacifiers.  Either 24-36 cloth diapers and waterproof diaper covers or a box of disposable diapers. You may need to use as many as 10-12 diapers per day.  How do I prepare for an emergency? Prepare for an emergency by:  Knowing how to get to the nearest hospital.  Listing the phone numbers of your baby's health care providers near your home phone and in your cell phone.  How do I prepare my family?  Decide how to handle visitors.  If you have other children: ? Talk with them about the baby coming home. Ask them how they feel about it. ? Read a book together about  being a new big brother or sister. ? Find ways to let them help you prepare for the new baby. ? Have someone ready to care for them while you are in the hospital. This information is not intended to replace advice given to you by your health care provider. Make sure you discuss any questions you have with your health care provider. Document Released: 10/24/2008 Document Revised: 04/18/2016 Document Reviewed: 10/19/2014 Elsevier Interactive Patient Education  2018 Elsevier Inc.  Braxton Hicks Contractions Contractions of the uterus can occur throughout pregnancy, but they are not always a sign that you are in labor. You may have practice contractions called Braxton Hicks contractions. These false labor contractions are sometimes confused with true labor. What are Braxton Hicks contractions? Braxton Hicks contractions are tightening movements that occur in the muscles of the uterus before labor. Unlike true labor contractions, these contractions do not result in opening (dilation) and thinning of the cervix. Toward the end of pregnancy (32-34 weeks), Braxton Hicks contractions can happen more often and may become stronger. These contractions are sometimes difficult to tell apart from true labor because they can be very uncomfortable. You should not feel embarrassed if you go to the hospital with false labor. Sometimes, the only way to tell if you are in true labor is for your health care provider to look for changes in the cervix. The health care provider will do a   physical exam and may monitor your contractions. If you are not in true labor, the exam should show that your cervix is not dilating and your water has not broken. If there are other health problems associated with your pregnancy, it is completely safe for you to be sent home with false labor. You may continue to have Braxton Hicks contractions until you go into true labor. How to tell the difference between true labor and false labor True  labor  Contractions last 30-70 seconds.  Contractions become very regular.  Discomfort is usually felt in the top of the uterus, and it spreads to the lower abdomen and low back.  Contractions do not go away with walking.  Contractions usually become more intense and increase in frequency.  The cervix dilates and gets thinner. False labor  Contractions are usually shorter and not as strong as true labor contractions.  Contractions are usually irregular.  Contractions are often felt in the front of the lower abdomen and in the groin.  Contractions may go away when you walk around or change positions while lying down.  Contractions get weaker and are shorter-lasting as time goes on.  The cervix usually does not dilate or become thin. Follow these instructions at home:  Take over-the-counter and prescription medicines only as told by your health care provider.  Keep up with your usual exercises and follow other instructions from your health care provider.  Eat and drink lightly if you think you are going into labor.  If Braxton Hicks contractions are making you uncomfortable: ? Change your position from lying down or resting to walking, or change from walking to resting. ? Sit and rest in a tub of warm water. ? Drink enough fluid to keep your urine pale yellow. Dehydration may cause these contractions. ? Do slow and deep breathing several times an hour.  Keep all follow-up prenatal visits as told by your health care provider. This is important. Contact a health care provider if:  You have a fever.  You have continuous pain in your abdomen. Get help right away if:  Your contractions become stronger, more regular, and closer together.  You have fluid leaking or gushing from your vagina.  You pass blood-tinged mucus (bloody show).  You have bleeding from your vagina.  You have low back pain that you never had before.  You feel your baby's head pushing down and  causing pelvic pressure.  Your baby is not moving inside you as much as it used to. Summary  Contractions that occur before labor are called Braxton Hicks contractions, false labor, or practice contractions.  Braxton Hicks contractions are usually shorter, weaker, farther apart, and less regular than true labor contractions. True labor contractions usually become progressively stronger and regular and they become more frequent.  Manage discomfort from Braxton Hicks contractions by changing position, resting in a warm bath, drinking plenty of water, or practicing deep breathing. This information is not intended to replace advice given to you by your health care provider. Make sure you discuss any questions you have with your health care provider. Document Released: 03/27/2017 Document Revised: 03/27/2017 Document Reviewed: 03/27/2017 Elsevier Interactive Patient Education  2018 Elsevier Inc.  Third Trimester of Pregnancy The third trimester is from week 29 through week 42, months 7 through 9. This trimester is when your unborn baby (fetus) is growing very fast. At the end of the ninth month, the unborn baby is about 20 inches in length. It weighs about 6-10 pounds. Follow   these instructions at home:  Avoid all smoking, herbs, and alcohol. Avoid drugs not approved by your doctor.  Do not use any tobacco products, including cigarettes, chewing tobacco, and electronic cigarettes. If you need help quitting, ask your doctor. You may get counseling or other support to help you quit.  Only take medicine as told by your doctor. Some medicines are safe and some are not during pregnancy.  Exercise only as told by your doctor. Stop exercising if you start having cramps.  Eat regular, healthy meals.  Wear a good support bra if your breasts are tender.  Do not use hot tubs, steam rooms, or saunas.  Wear your seat belt when driving.  Avoid raw meat, uncooked cheese, and liter boxes and soil used  by cats.  Take your prenatal vitamins.  Take 1500-2000 milligrams of calcium daily starting at the 20th week of pregnancy until you deliver your baby.  Try taking medicine that helps you poop (stool softener) as needed, and if your doctor approves. Eat more fiber by eating fresh fruit, vegetables, and whole grains. Drink enough fluids to keep your pee (urine) clear or pale yellow.  Take warm water baths (sitz baths) to soothe pain or discomfort caused by hemorrhoids. Use hemorrhoid cream if your doctor approves.  If you have puffy, bulging veins (varicose veins), wear support hose. Raise (elevate) your feet for 15 minutes, 3-4 times a day. Limit salt in your diet.  Avoid heavy lifting, wear low heels, and sit up straight.  Rest with your legs raised if you have leg cramps or low back pain.  Visit your dentist if you have not gone during your pregnancy. Use a soft toothbrush to brush your teeth. Be gentle when you floss.  You can have sex (intercourse) unless your doctor tells you not to.  Do not travel far distances unless you must. Only do so with your doctor's approval.  Take prenatal classes.  Practice driving to the hospital.  Pack your hospital bag.  Prepare the baby's room.  Go to your doctor visits. Get help if:  You are not sure if you are in labor or if your water has broken.  You are dizzy.  You have mild cramps or pressure in your lower belly (abdominal).  You have a nagging pain in your belly area.  You continue to feel sick to your stomach (nauseous), throw up (vomit), or have watery poop (diarrhea).  You have bad smelling fluid coming from your vagina.  You have pain with peeing (urination). Get help right away if:  You have a fever.  You are leaking fluid from your vagina.  You are spotting or bleeding from your vagina.  You have severe belly cramping or pain.  You lose or gain weight rapidly.  You have trouble catching your breath and have  chest pain.  You notice sudden or extreme puffiness (swelling) of your face, hands, ankles, feet, or legs.  You have not felt the baby move in over an hour.  You have severe headaches that do not go away with medicine.  You have vision changes. This information is not intended to replace advice given to you by your health care provider. Make sure you discuss any questions you have with your health care provider. Document Released: 02/05/2010 Document Revised: 04/18/2016 Document Reviewed: 01/12/2013 Elsevier Interactive Patient Education  2017 Elsevier Inc.  Contraception Choices Contraception, also called birth control, refers to methods or devices that prevent pregnancy. Hormonal methods Contraceptive implant A contraceptive   implant is a thin, plastic tube that contains a hormone. It is inserted into the upper part of the arm. It can remain in place for up to 3 years. Progestin-only injections Progestin-only injections are injections of progestin, a synthetic form of the hormone progesterone. They are given every 3 months by a health care provider. Birth control pills Birth control pills are pills that contain hormones that prevent pregnancy. They must be taken once a day, preferably at the same time each day. Birth control patch The birth control patch contains hormones that prevent pregnancy. It is placed on the skin and must be changed once a week for three weeks and removed on the fourth week. A prescription is needed to use this method of contraception. Vaginal ring A vaginal ring contains hormones that prevent pregnancy. It is placed in the vagina for three weeks and removed on the fourth week. After that, the process is repeated with a new ring. A prescription is needed to use this method of contraception. Emergency contraceptive Emergency contraceptives prevent pregnancy after unprotected sex. They come in pill form and can be taken up to 5 days after sex. They work best the  sooner they are taken after having sex. Most emergency contraceptives are available without a prescription. This method should not be used as your only form of birth control. Barrier methods Female condom A female condom is a thin sheath that is worn over the penis during sex. Condoms keep sperm from going inside a woman's body. They can be used with a spermicide to increase their effectiveness. They should be disposed after a single use. Female condom A female condom is a soft, loose-fitting sheath that is put into the vagina before sex. The condom keeps sperm from going inside a woman's body. They should be disposed after a single use. Diaphragm A diaphragm is a soft, dome-shaped barrier. It is inserted into the vagina before sex, along with a spermicide. The diaphragm blocks sperm from entering the uterus, and the spermicide kills sperm. A diaphragm should be left in the vagina for 6-8 hours after sex and removed within 24 hours. A diaphragm is prescribed and fitted by a health care provider. A diaphragm should be replaced every 1-2 years, after giving birth, after gaining more than 15 lb (6.8 kg), and after pelvic surgery. Cervical cap A cervical cap is a round, soft latex or plastic cup that fits over the cervix. It is inserted into the vagina before sex, along with spermicide. It blocks sperm from entering the uterus. The cap should be left in place for 6-8 hours after sex and removed within 48 hours. A cervical cap must be prescribed and fitted by a health care provider. It should be replaced every 2 years. Sponge A sponge is a soft, circular piece of polyurethane foam with spermicide on it. The sponge helps block sperm from entering the uterus, and the spermicide kills sperm. To use it, you make it wet and then insert it into the vagina. It should be inserted before sex, left in for at least 6 hours after sex, and removed and thrown away within 30 hours. Spermicides Spermicides are chemicals that  kill or block sperm from entering the cervix and uterus. They can come as a cream, jelly, suppository, foam, or tablet. A spermicide should be inserted into the vagina with an applicator at least 10-15 minutes before sex to allow time for it to work. The process must be repeated every time you have sex. Spermicides do   not require a prescription. Intrauterine contraception Intrauterine device (IUD) An IUD is a T-shaped device that is put in a woman's uterus. There are two types:  Hormone IUD.This type contains progestin, a synthetic form of the hormone progesterone. This type can stay in place for 3-5 years.  Copper IUD.This type is wrapped in copper wire. It can stay in place for 10 years.  Permanent methods of contraception Female tubal ligation In this method, a woman's fallopian tubes are sealed, tied, or blocked during surgery to prevent eggs from traveling to the uterus. Hysteroscopic sterilization In this method, a small, flexible insert is placed into each fallopian tube. The inserts cause scar tissue to form in the fallopian tubes and block them, so sperm cannot reach an egg. The procedure takes about 3 months to be effective. Another form of birth control must be used during those 3 months. Female sterilization This is a procedure to tie off the tubes that carry sperm (vasectomy). After the procedure, the man can still ejaculate fluid (semen). Natural planning methods Natural family planning In this method, a couple does not have sex on days when the woman could become pregnant. Calendar method This means keeping track of the length of each menstrual cycle, identifying the days when pregnancy can happen, and not having sex on those days. Ovulation method In this method, a couple avoids sex during ovulation. Symptothermal method This method involves not having sex during ovulation. The woman typically checks for ovulation by watching changes in her temperature and in the consistency of  cervical mucus. Post-ovulation method In this method, a couple waits to have sex until after ovulation. Summary  Contraception, also called birth control, means methods or devices that prevent pregnancy.  Hormonal methods of contraception include implants, injections, pills, patches, vaginal rings, and emergency contraceptives.  Barrier methods of contraception can include female condoms, female condoms, diaphragms, cervical caps, sponges, and spermicides.  There are two types of IUDs (intrauterine devices). An IUD can be put in a woman's uterus to prevent pregnancy for 3-5 years.  Permanent sterilization can be done through a procedure for males, females, or both.  Natural family planning methods involve not having sex on days when the woman could become pregnant. This information is not intended to replace advice given to you by your health care provider. Make sure you discuss any questions you have with your health care provider. Document Released: 11/11/2005 Document Revised: 12/14/2016 Document Reviewed: 12/14/2016 Elsevier Interactive Patient Education  2018 Elsevier Inc.  

## 2018-03-06 LAB — RPR: RPR Ser Ql: NONREACTIVE

## 2018-03-06 LAB — GLUCOSE TOLERANCE, 2 HOURS W/ 1HR
GLUCOSE, FASTING: 70 mg/dL (ref 65–91)
Glucose, 1 hour: 77 mg/dL (ref 65–179)
Glucose, 2 hour: 61 mg/dL — ABNORMAL LOW (ref 65–152)

## 2018-03-06 LAB — CBC
Hematocrit: 31.4 % — ABNORMAL LOW (ref 34.0–46.6)
Hemoglobin: 10.6 g/dL — ABNORMAL LOW (ref 11.1–15.9)
MCH: 31.5 pg (ref 26.6–33.0)
MCHC: 33.8 g/dL (ref 31.5–35.7)
MCV: 94 fL (ref 79–97)
PLATELETS: 255 10*3/uL (ref 150–379)
RBC: 3.36 x10E6/uL — ABNORMAL LOW (ref 3.77–5.28)
RDW: 14.3 % (ref 12.3–15.4)
WBC: 10.6 10*3/uL (ref 3.4–10.8)

## 2018-03-06 LAB — HIV ANTIBODY (ROUTINE TESTING W REFLEX): HIV Screen 4th Generation wRfx: NONREACTIVE

## 2018-03-09 ENCOUNTER — Other Ambulatory Visit: Payer: Self-pay | Admitting: Certified Nurse Midwife

## 2018-03-09 DIAGNOSIS — Z3400 Encounter for supervision of normal first pregnancy, unspecified trimester: Secondary | ICD-10-CM

## 2018-03-09 DIAGNOSIS — O99013 Anemia complicating pregnancy, third trimester: Secondary | ICD-10-CM

## 2018-03-09 DIAGNOSIS — O99019 Anemia complicating pregnancy, unspecified trimester: Secondary | ICD-10-CM | POA: Insufficient documentation

## 2018-03-09 MED ORDER — CITRANATAL BLOOM 90-1 MG PO TABS: 1.0000 | ORAL_TABLET | Freq: Every day | ORAL | 12 refills | Status: DC

## 2018-03-19 ENCOUNTER — Encounter: Payer: Self-pay | Admitting: Certified Nurse Midwife

## 2018-03-19 ENCOUNTER — Ambulatory Visit (INDEPENDENT_AMBULATORY_CARE_PROVIDER_SITE_OTHER): Payer: Medicaid Other | Admitting: Certified Nurse Midwife

## 2018-03-19 VITALS — BP 108/73 | HR 79 | Wt 239.4 lb

## 2018-03-19 DIAGNOSIS — Z3400 Encounter for supervision of normal first pregnancy, unspecified trimester: Secondary | ICD-10-CM

## 2018-03-19 DIAGNOSIS — O99013 Anemia complicating pregnancy, third trimester: Secondary | ICD-10-CM

## 2018-03-19 DIAGNOSIS — D649 Anemia, unspecified: Secondary | ICD-10-CM

## 2018-03-19 NOTE — Patient Instructions (Signed)
AREA PEDIATRIC/FAMILY PRACTICE PHYSICIANS  [facility] [address], [address] [place], [place]  [address] Phone - [phone]   Fax - [phone]  [facility] [address] [place], [place] [address] Phone - [phone]   Fax - [phone]  [facility] AM[facility] [address] [place], [place]  [address] Phone - [phone]   Fax - [phone]  [facility] [address] [place], [place]  [address] Phone - [phone]   Fax - [phone]  [facility] [address] [place], [place]  [address] Phone - [phone]   Fax - [phone]  [facility] [address] [place], [place]  [address] Phone - [phone]   Fax - [phone]  [facility] [address] [place], [place]  [address] Phone - [phone]   Fax - [phone]  [facility] [address] [place], [place]  [address] Phone - [phone]   Fax - [phone]  [facility] [address] [place], [place]  [address] Phone - [phone]   Fax - [phone] [facility] [address] [place], [place]  [address] Phone - [phone]   Fax - [phone]  [facility] [address] [place], [place]  [address] Phone - [phone]   Fax - [phone]  [facility] [address] [place], [place]  [address] Phone - [phone]   Fax - [phone]  [facility] [address] [place], [place]  [address] Phone - [phone]   Fax - [phone]  SHILPA GOSRANI [address] [place], [place]  [address] Phone - [phone]  [facility] [address] [place], [place]  [address] Phone - [phone]   Fax - [phone]  [facility] [address] [place], [place]  [address] Phone - [phone]   Fax - [phone]  [facility] [address] [place], [place]  [address] Phone - [phone]   Fax - [phone]  [facility] [address] [place], [place]  [address] Phone - [phone]   Fax - [phone]   [facility] [address] [place], [place]  [address] Phone - [phone]   Fax - [phone]  [facility] [address] [place], [place]  [address] Phone - [phone]   Fax - [phone]  DAVID RUBIN [address] [place], [place]  [address] Phone - [phone]   Fax - [phone]  [facility] [address] , [place]  [address] Phone - [phone]   Fax - [phone]  [facility] [address] , [place]  [address] Phone - [phone]   Fax - [phone] [facility] [address] [place], [place]  [address] Phone - [phone]   Fax - [phone]  [facility] [address] [place], [place]  [address] Phone - [phone]   Fax - [phone]  [facility] [address] [place], [place]  [address] Phone - [phone]   Fax - [phone]  [facility] Charlene Flemming MD [address] [place]  [address] Phone [phone]  Fax [phone]   

## 2018-03-19 NOTE — Progress Notes (Signed)
   PRENATAL VISIT NOTE  Subjective:  Debra Reilly is a 22 y.o. G1P0 at 2970w3d being seen today for ongoing prenatal care.  She is currently monitored for the following issues for this low-risk pregnancy and has Supervision of normal first pregnancy, antepartum; Obesity affecting pregnancy, antepartum; Obesity (BMI 30.0-34.9); Abnormal cervical Papanicolaou smear affecting pregnancy, antepartum; LGSIL on Pap smear of cervix; and Anemia in pregnancy on their problem list.  Patient reports no complaints.  Contractions: Not present. Vag. Bleeding: None.  Movement: Present. Denies leaking of fluid.   The following portions of the patient's history were reviewed and updated as appropriate: allergies, current medications, past family history, past medical history, past social history, past surgical history and problem list. Problem list updated.  Objective:   Vitals:   03/19/18 0953  BP: 108/73  Pulse: 79  Weight: 239 lb 6.4 oz (108.6 kg)    Fetal Status:     Movement: Present     General:  Alert, oriented and cooperative. Patient is in no acute distress.  Skin: Skin is warm and dry. No rash noted.   Cardiovascular: Normal heart rate noted  Respiratory: Normal respiratory effort, no problems with respiration noted  Abdomen: Soft, gravid, appropriate for gestational age.  Pain/Pressure: Absent     Pelvic: Cervical exam deferred        Extremities: Normal range of motion.  Edema: Trace  Mental Status: Normal mood and affect. Normal behavior. Normal judgment and thought content.   Assessment and Plan:  Pregnancy: G1P0 at 3170w3d  1. Supervision of normal first pregnancy, antepartum      Doing well  2. Anemia during pregnancy in third trimester     Taking Bloom.    Preterm labor symptoms and general obstetric precautions including but not limited to vaginal bleeding, contractions, leaking of fluid and fetal movement were reviewed in detail with the patient. Please refer to After Visit  Summary for other counseling recommendations.  Return in about 2 weeks (around 04/02/2018) for ROB.  Future Appointments  Date Time Provider Department Center  04/02/2018 11:15 AM Roe Coombsenney, Rachelle A, CNM [facility] None    Roe Coombsachelle A Denney, CNM

## 2018-03-19 NOTE — Progress Notes (Signed)
Patient reports fetal movement, denies pain. 

## 2018-04-02 ENCOUNTER — Encounter: Payer: Self-pay | Admitting: Certified Nurse Midwife

## 2018-04-02 ENCOUNTER — Ambulatory Visit (INDEPENDENT_AMBULATORY_CARE_PROVIDER_SITE_OTHER): Payer: Medicaid Other | Admitting: Certified Nurse Midwife

## 2018-04-02 ENCOUNTER — Other Ambulatory Visit: Payer: Self-pay

## 2018-04-02 VITALS — BP 121/79 | HR 88 | Wt 244.7 lb

## 2018-04-02 DIAGNOSIS — O99210 Obesity complicating pregnancy, unspecified trimester: Secondary | ICD-10-CM

## 2018-04-02 DIAGNOSIS — Z3400 Encounter for supervision of normal first pregnancy, unspecified trimester: Secondary | ICD-10-CM

## 2018-04-02 DIAGNOSIS — O99013 Anemia complicating pregnancy, third trimester: Secondary | ICD-10-CM

## 2018-04-02 NOTE — Progress Notes (Signed)
   PRENATAL VISIT NOTE  Subjective:  Debra Reilly is a 22 y.o. G1P0 at [redacted]w[redacted]d being seen today for ongoing prenatal care.  She is currently monitored for the following issues for this low-risk pregnancy and has Supervision of normal first pregnancy, antepartum; Obesity affecting pregnancy, antepartum; Obesity (BMI 30.0-34.9); Abnormal cervical Papanicolaou smear affecting pregnancy, antepartum; LGSIL on Pap smear of cervix; and Anemia in pregnancy on their problem list.  Patient reports no complaints.  Contractions: Not present. Vag. Bleeding: None.  Movement: Present. Denies leaking of fluid.   The following portions of the patient's history were reviewed and updated as appropriate: allergies, current medications, past family history, past medical history, past social history, past surgical history and problem list. Problem list updated.  Objective:   Vitals:   04/02/18 1135  BP: 121/79  Pulse: 88  Weight: 244 lb 11.2 oz (111 kg)    Fetal Status: Fetal Heart Rate (bpm): 142; doppler Fundal Height: 32 cm Movement: Present     General:  Alert, oriented and cooperative. Patient is in no acute distress.  Skin: Skin is warm and dry. No rash noted.   Cardiovascular: Normal heart rate noted  Respiratory: Normal respiratory effort, no problems with respiration noted  Abdomen: Soft, gravid, appropriate for gestational age.  Pain/Pressure: Absent     Pelvic: Cervical exam deferred        Extremities: Normal range of motion.  Edema: None  Mental Status: Normal mood and affect. Normal behavior. Normal judgment and thought content.   Assessment and Plan:  Pregnancy: G1P0 at [redacted]w[redacted]d  1. Supervision of normal first pregnancy, antepartum     Doing well. Pediatricians discussed.   2. Obesity affecting pregnancy, antepartum      14 lb weight gain this pregnancy  3. Anemia during pregnancy in third trimester    Taking iron, slightly anemic.   Preterm labor symptoms and general obstetric  precautions including but not limited to vaginal bleeding, contractions, leaking of fluid and fetal movement were reviewed in detail with the patient. Please refer to After Visit Summary for other counseling recommendations.  Return in about 2 weeks (around 04/16/2018) for ROB.  Future Appointments  Date Time Provider Department Center  04/16/2018  1:15 PM Roe Coombs, CNM [facility] None    Roe Coombs, CNM

## 2018-04-02 NOTE — Patient Instructions (Signed)
AREA PEDIATRIC/FAMILY PRACTICE PHYSICIANS  [facility] [address], [address] [place], [place]  [address] Phone - [phone]   Fax - [phone]  [facility] [address] [place], [place] [address] Phone - [phone]   Fax - [phone]  [facility] [address] [place], [place]  [address] Phone - [phone]   Fax - [phone]  [facility] [address] [place], [place]  [address] Phone - [phone]   Fax - [phone]  [facility] [address] [place], [place]  [address] Phone - [phone]   Fax - [phone]  [facility] [address] [place], [place]  [address] Phone - [phone]   Fax - [phone]  [facility] [address] [place], [place]  [address] Phone - [phone]   Fax - [phone]  [facility] [address] [place], [place]  [address] Phone - [phone]   Fax - [phone]  [facility] [address] [place], [place]  [address] Phone - [phone]   Fax - [phone] [facility] [address] [place], [place]  [address] Phone - [phone]   Fax - [phone]  [facility] [address] [place], [place]  [address] Phone - [phone]   Fax - [phone]  [facility] [address] [place], [place]  [address] Phone - [phone]   Fax - [phone]  [facility] [address] [place], [place]  [address] Phone - [phone]   Fax - [phone]  SHILPA GOSRANI [address] [place], [place]  [address] Phone - [phone]  [facility] [address] [place], [place]  [address] Phone - [phone]   Fax - [phone]  [facility] [address] [place], [place]  [address] Phone - [phone]   Fax - [phone]  [facility] [address] [place], [place]  [address] Phone - [phone]   Fax - [phone]  [facility] [address] [place], [place]  [address] Phone - [phone]   Fax - [phone]   [facility] [address] [place], [place]  [address] Phone - [phone]   Fax - [phone]  [facility] [address] [place], [place]  [address] Phone - [phone]   Fax - [phone]  DAVID RUBIN [address] [place], [place]  [address] Phone - [phone]   Fax - [phone]  [facility] [address] [place], [place]  [address] Phone - [phone]   Fax - [phone]  [facility] [address] , [place]  [address] Phone - [phone]   Fax - [phone] [facility] [address] [place], [place]  [address] Phone - [phone]   Fax - [phone]  [facility] [address] [place], [place]  [address] Phone - [phone]   Fax - [phone]  [facility] [address] [place], [place]  [address] Phone - [phone]   Fax - [phone]  [facility] Charlene Flemming MD [address] [place]  [address] Phone [phone]  Fax [phone]   

## 2018-04-16 ENCOUNTER — Ambulatory Visit (INDEPENDENT_AMBULATORY_CARE_PROVIDER_SITE_OTHER): Payer: Medicaid Other | Admitting: Certified Nurse Midwife

## 2018-04-16 ENCOUNTER — Encounter: Payer: Self-pay | Admitting: Certified Nurse Midwife

## 2018-04-16 VITALS — BP 121/80 | HR 88 | Wt 247.8 lb

## 2018-04-16 DIAGNOSIS — Z3400 Encounter for supervision of normal first pregnancy, unspecified trimester: Secondary | ICD-10-CM

## 2018-04-16 DIAGNOSIS — D649 Anemia, unspecified: Secondary | ICD-10-CM

## 2018-04-16 DIAGNOSIS — Z3403 Encounter for supervision of normal first pregnancy, third trimester: Secondary | ICD-10-CM

## 2018-04-16 DIAGNOSIS — O99013 Anemia complicating pregnancy, third trimester: Secondary | ICD-10-CM

## 2018-04-16 NOTE — Progress Notes (Signed)
   PRENATAL VISIT NOTE  Subjective:  Debra Reilly is a 22 y.o. G1P0 at [redacted]w[redacted]d being seen today for ongoing prenatal care.  She is currently monitored for the following issues for this low-risk pregnancy and has Supervision of normal first pregnancy, antepartum; Obesity affecting pregnancy, antepartum; Obesity (BMI 30.0-34.9); Abnormal cervical Papanicolaou smear affecting pregnancy, antepartum; LGSIL on Pap smear of cervix; and Anemia in pregnancy on their problem list.  Patient reports no complaints.  Contractions: Not present. Vag. Bleeding: None.  Movement: Present. Denies leaking of fluid.   The following portions of the patient's history were reviewed and updated as appropriate: allergies, current medications, past family history, past medical history, past social history, past surgical history and problem list. Problem list updated.  Objective:   Vitals:   04/16/18 1327  BP: 121/80  Pulse: 88  Weight: 247 lb 12.8 oz (112.4 kg)    Fetal Status: Fetal Heart Rate (bpm): 147; doppler Fundal Height: 34 cm Movement: Present     General:  Alert, oriented and cooperative. Patient is in no acute distress.  Skin: Skin is warm and dry. No rash noted.   Cardiovascular: Normal heart rate noted  Respiratory: Normal respiratory effort, no problems with respiration noted  Abdomen: Soft, gravid, appropriate for gestational age.  Pain/Pressure: Absent     Pelvic: Cervical exam deferred        Extremities: Normal range of motion.  Edema: None  Mental Status: Normal mood and affect. Normal behavior. Normal judgment and thought content.   Assessment and Plan:  Pregnancy: G1P0 at [redacted]w[redacted]d  1. Supervision of normal first pregnancy, antepartum       Doing well.  F/U growth ordered.   - [place] MFM OB FOLLOW UP; Future  2. Anemia during pregnancy in third trimester     HGB: 10.6, taking iron supplementation  Preterm labor symptoms and general obstetric precautions including but not limited to vaginal  bleeding, contractions, leaking of fluid and fetal movement were reviewed in detail with the patient. Please refer to After Visit Summary for other counseling recommendations.  Return in about 2 weeks (around 04/30/2018) for ROB, GBS.  Future Appointments  Date Time Provider Department Center  04/30/2018  1:45 PM [facility] [place] 2 [facility]    Roe Coombs, CNM

## 2018-04-16 NOTE — Patient Instructions (Signed)
Vaginal Delivery Vaginal delivery means that you will give birth by pushing your baby out of your birth canal (vagina). A team of health care providers will help you before, during, and after vaginal delivery. Birth experiences are unique for every woman and every pregnancy, and birth experiences vary depending on where you choose to give birth. What should I do to prepare for my baby's birth? Before your baby is born, it is important to talk with your health care provider about:  Your labor and delivery preferences. These may include: ? Medicines that you may be given. ? How you will manage your pain. This might include non-medical pain relief techniques or injectable pain relief such as epidural analgesia. ? How you and your baby will be monitored during labor and delivery. ? Who may be in the labor and delivery room with you. ? Your feelings about surgical delivery of your baby (cesarean delivery, or C-section) if this becomes necessary. ? Your feelings about receiving donated blood through an IV tube (blood transfusion) if this becomes necessary.  Whether you are able: ? To take pictures or videos of the birth. ? To eat during labor and delivery. ? To move around, walk, or change positions during labor and delivery.  What to expect after your baby is born, such as: ? Whether delayed umbilical cord clamping and cutting is offered. ? Who will care for your baby right after birth. ? Medicines or tests that may be recommended for your baby. ? Whether breastfeeding is supported in your hospital or birth center. ? How long you will be in the hospital or birth center.  How any medical conditions you have may affect your baby or your labor and delivery experience.  To prepare for your baby's birth, you should also:  Attend all of your health care visits before delivery (prenatal visits) as recommended by your health care provider. This is important.  Prepare your home for your baby's  arrival. Make sure that you have: ? Diapers. ? Baby clothing. ? Feeding equipment. ? Safe sleeping arrangements for you and your baby.  Install a car seat in your vehicle. Have your car seat checked by a certified car seat installer to make sure that it is installed safely.  Think about who will help you with your new baby at home for at least the first several weeks after delivery.  What can I expect when I arrive at the birth center or hospital? Once you are in labor and have been admitted into the hospital or birth center, your health care provider may:  Review your pregnancy history and any concerns you have.  Insert an IV tube into one of your veins. This is used to give you fluids and medicines.  Check your blood pressure, pulse, temperature, and heart rate (vital signs).  Check whether your bag of water (amniotic sac) has broken (ruptured).  Talk with you about your birth plan and discuss pain control options.  Monitoring Your health care provider may monitor your contractions (uterine monitoring) and your baby's heart rate (fetal monitoring). You may need to be monitored:  Often, but not continuously (intermittently).  All the time or for long periods at a time (continuously). Continuous monitoring may be needed if: ? You are taking certain medicines, such as medicine to relieve pain or make your contractions stronger. ? You have pregnancy or labor complications.  Monitoring may be done by:  Placing a special stethoscope or a handheld monitoring device on your abdomen to   check your baby's heartbeat, and feeling your abdomen for contractions. This method of monitoring does not continuously record your baby's heartbeat or your contractions.  Placing monitors on your abdomen (external monitors) to record your baby's heartbeat and the frequency and length of contractions. You may not have to wear external monitors all the time.  Placing monitors inside of your uterus  (internal monitors) to record your baby's heartbeat and the frequency, length, and strength of your contractions. ? Your health care provider may use internal monitors if he or she needs more information about the strength of your contractions or your baby's heart rate. ? Internal monitors are put in place by passing a thin, flexible wire through your vagina and into your uterus. Depending on the type of monitor, it may remain in your uterus or on your baby's head until birth. ? Your health care provider will discuss the benefits and risks of internal monitoring with you and will ask for your permission before inserting the monitors.  Telemetry. This is a type of continuous monitoring that can be done with external or internal monitors. Instead of having to stay in bed, you are able to move around during telemetry. Ask your health care provider if telemetry is an option for you.  Physical exam Your health care provider may perform a physical exam. This may include:  Checking whether your baby is positioned: ? With the head toward your vagina (head-down). This is most common. ? With the head toward the top of your uterus (head-up or breech). If your baby is in a breech position, your health care provider may try to turn your baby to a head-down position so you can deliver vaginally. If it does not seem that your baby can be born vaginally, your provider may recommend surgery to deliver your baby. In rare cases, you may be able to deliver vaginally if your baby is head-up (breech delivery). ? Lying sideways (transverse). Babies that are lying sideways cannot be delivered vaginally.  Checking your cervix to determine: ? Whether it is thinning out (effacing). ? Whether it is opening up (dilating). ? How low your baby has moved into your birth canal.  What are the three stages of labor and delivery?  Normal labor and delivery is divided into the following three stages: Stage 1  Stage 1 is the  longest stage of labor, and it can last for hours or days. Stage 1 includes: ? Early labor. This is when contractions may be irregular, or regular and mild. Generally, early labor contractions are more than 10 minutes apart. ? Active labor. This is when contractions get longer, more regular, more frequent, and more intense. ? The transition phase. This is when contractions happen very close together, are very intense, and may last longer than during any other part of labor.  Contractions generally feel mild, infrequent, and irregular at first. They get stronger, more frequent (about every 2-3 minutes), and more regular as you progress from early labor through active labor and transition.  Many women progress through stage 1 naturally, but you may need help to continue making progress. If this happens, your health care provider may talk with you about: ? Rupturing your amniotic sac if it has not ruptured yet. ? Giving you medicine to help make your contractions stronger and more frequent.  Stage 1 ends when your cervix is completely dilated to 4 inches (10 cm) and completely effaced. This happens at the end of the transition phase. Stage 2  Once   your cervix is completely effaced and dilated to 4 inches (10 cm), you may start to feel an urge to push. It is common for the body to naturally take a rest before feeling the urge to push, especially if you received an epidural or certain other pain medicines. This rest period may last for up to 1-2 hours, depending on your unique labor experience.  During stage 2, contractions are generally less painful, because pushing helps relieve contraction pain. Instead of contraction pain, you may feel stretching and burning pain, especially when the widest part of your baby's head passes through the vaginal opening (crowning).  Your health care provider will closely monitor your pushing progress and your baby's progress through the vagina during stage 2.  Your  health care provider may massage the area of skin between your vaginal opening and anus (perineum) or apply warm compresses to your perineum. This helps it stretch as the baby's head starts to crown, which can help prevent perineal tearing. ? In some cases, an incision may be made in your perineum (episiotomy) to allow the baby to pass through the vaginal opening. An episiotomy helps to make the opening of the vagina larger to allow more room for the baby to fit through.  It is very important to breathe and focus so your health care provider can control the delivery of your baby's head. Your health care provider may have you decrease the intensity of your pushing, to help prevent perineal tearing.  After delivery of your baby's head, the shoulders and the rest of the body generally deliver very quickly and without difficulty.  Once your baby is delivered, the umbilical cord may be cut right away, or this may be delayed for 1-2 minutes, depending on your baby's health. This may vary among health care providers, hospitals, and birth centers.  If you and your baby are healthy enough, your baby may be placed on your chest or abdomen to help maintain the baby's temperature and to help you bond with each other. Some mothers and babies start breastfeeding at this time. Your health care team will dry your baby and help keep your baby warm during this time.  Your baby may need immediate care if he or she: ? Showed signs of distress during labor. ? Has a medical condition. ? Was born too early (prematurely). ? Had a bowel movement before birth (meconium). ? Shows signs of difficulty transitioning from being inside the uterus to being outside of the uterus. If you are planning to breastfeed, your health care team will help you begin a feeding. Stage 3  The third stage of labor starts immediately after the birth of your baby and ends after you deliver the placenta. The placenta is an organ that develops  during pregnancy to provide oxygen and nutrients to your baby in the womb.  Delivering the placenta may require some pushing, and you may have mild contractions. Breastfeeding can stimulate contractions to help you deliver the placenta.  After the placenta is delivered, your uterus should tighten (contract) and become firm. This helps to stop bleeding in your uterus. To help your uterus contract and to control bleeding, your health care provider may: ? Give you medicine by injection, through an IV tube, by mouth, or through your rectum (rectally). ? Massage your abdomen or perform a vaginal exam to remove any blood clots that are left in your uterus. ? Empty your bladder by placing a thin, flexible tube (catheter) into your bladder. ? Encourage  you to breastfeed your baby. After labor is over, you and your baby will be monitored closely to ensure that you are both healthy until you are ready to go home. Your health care team will teach you how to care for yourself and your baby. This information is not intended to replace advice given to you by your health care provider. Make sure you discuss any questions you have with your health care provider. Document Released: 08/20/2008 Document Revised: 05/31/2016 Document Reviewed: 11/26/2015 Elsevier Interactive Patient Education  2018 [place] [employer] Pregnancy The third trimester is from week 29 through week 42, months 7 through 9. This trimester is when your unborn baby (fetus) is growing very fast. At the end of the ninth month, the unborn baby is about 20 inches in length. It weighs about 6-10 pounds. Follow these instructions at home:  Avoid all smoking, herbs, and alcohol. Avoid drugs not approved by your doctor.  Do not use any tobacco products, including cigarettes, chewing tobacco, and electronic cigarettes. If you need help quitting, ask your doctor. You may get counseling or other support to help you quit.  Only take  medicine as told by your doctor. Some medicines are safe and some are not during pregnancy.  Exercise only as told by your doctor. Stop exercising if you start having cramps.  Eat regular, healthy meals.  Wear a good support bra if your breasts are tender.  Do not use hot tubs, steam rooms, or saunas.  Wear your seat belt when driving.  Avoid raw meat, uncooked cheese, and liter boxes and soil used by cats.  Take your prenatal vitamins.  Take 1500-2000 milligrams of calcium daily starting at the 20th week of pregnancy until you deliver your baby.  Try taking medicine that helps you poop (stool softener) as needed, and if your doctor approves. Eat more fiber by eating fresh fruit, vegetables, and whole grains. Drink enough fluids to keep your pee (urine) clear or pale yellow.  Take warm water baths (sitz baths) to soothe pain or discomfort caused by hemorrhoids. Use hemorrhoid cream if your doctor approves.  If you have puffy, bulging veins (varicose veins), wear support hose. Raise (elevate) your feet for 15 minutes, 3-4 times a day. Limit salt in your diet.  Avoid heavy lifting, wear low heels, and sit up straight.  Rest with your legs raised if you have leg cramps or low back pain.  Visit your dentist if you have not gone during your pregnancy. Use a soft toothbrush to brush your teeth. Be gentle when you floss.  You can have sex (intercourse) unless your doctor tells you not to.  Do not travel far distances unless you must. Only do so with your doctor's approval.  Take prenatal classes.  Practice driving to the hospital.  Pack your hospital bag.  Prepare the baby's room.  Go to your doctor visits. Get help if:  You are not sure if you are in labor or if your water has broken.  You are dizzy.  You have mild cramps or pressure in your lower belly (abdominal).  You have a nagging pain in your belly area.  You continue to feel sick to your stomach (nauseous), throw  up (vomit), or have watery poop (diarrhea).  You have bad smelling fluid coming from your vagina.  You have pain with peeing (urination). Get help right away if:  You have a fever.  You are leaking fluid from your vagina.  You are  spotting or bleeding from your vagina.  You have severe belly cramping or pain.  You lose or gain weight rapidly.  You have trouble catching your breath and have chest pain.  You notice sudden or extreme puffiness (swelling) of your face, hands, ankles, feet, or legs.  You have not felt the baby move in over an hour.  You have severe headaches that do not go away with medicine.  You have vision changes. This information is not intended to replace advice given to you by your health care provider. Make sure you discuss any questions you have with your health care provider. Document Released: 02/05/2010 Document Revised: 04/18/2016 Document Reviewed: 01/12/2013 Elsevier Interactive Patient Education  2017 [employer].  Pregnancy, The Father's Role A father has an important role during his partner's pregnancy, labor, delivery, and after the birth of the baby. It is important to help and support your partner through this new period. There are many physical and emotional changes that happen. To be helpful and supportive during this time, you should know and understand what is happening to your partner during pregnancy, labor, delivery, and after the baby is born. What are the stages of pregnancy? Pregnancy usually lasts about 40 weeks. The pregnancy is divided into three trimesters. First Trimester During the first 13 weeks, your partner may:  Feel tired.  Have painful breasts.  Feel nauseous or throw up.  Urinate more often.  Have mood changes.  All of these changes are normal. If they are happening, try to be helpful, supportive, and understanding. This may include helping with household duties and activities and spending more time with each  other. Second Trimester During the next 14-28 weeks:  Your partner will likely feel better and more energetic.  This is the best time of the pregnancy to be more active together.  You will be able to see her belly showing the pregnancy.  You may be able to feel the baby kick.  Your partner may have soreness or aching in her back as she gains weight. You can help her by carrying heavy things and by rubbing her back when she is feeling sore.  Third Trimester During the final 12 weeks, your partner may:  Become more uncomfortable as the baby grows.  Have a hard time doing everyday activities, and her balance may be off.  Have a hard time bending over.  Tire easily.  Have difficulty sleeping.  At this time, the birth of your baby is close. You and your partner may have concerns or questions. This is normal. Talk with each other and with your health care provider. Continue to help your partner with housework, encourage her to rest, and rub her sore back and legs, if this helps her. What can I expect or do during the pregnancy? You can expect to experience some changes. There are also many things you can do to help prepare you and your partner for your baby. Emotional Changes During your partner's pregnancy, emotional changes for you may include:  Having feelings of happiness, excitement, and pride.  Being concerned about having new responsibilities, such as financial or educational responsibilities.  Feeling overwhelmed or scared.  Being worried that a baby will change your relationship with your partner.  These feelings are normal. Talk about them openly with your partner and your health care provider. Prenatal Care Attend prenatal care visits with your partner. This is a good time for you to get to know your health care provider, follow the pregnancy,  and ask questions.  Prenatal visits usually occur one time each month for six months, then every two weeks for two months,  and then one time each week during the last month. You may have more prenatal visits if your health care provider believes this is needed.  Your health care provider usually does an ultrasound of the baby at one of the prenatal visits. This may happen more often if your health care provider thinks it is needed.  Sexual Activity Sexual intercourse is safe unless there is a problem with the pregnancy and your health care provider advises you to not have sexual intercourse. Because physical and emotional changes happen in pregnancy, your partner may not want to have sex during certain times. Trying different positions may make sexual intercourse more comfortable. However, always respect your partner's decision if she does not want to have sex. It is important for both of you to discuss your feelings and desires. Talk with your health care provider about any questions that you may have about sexual intercourse during pregnancy. Childbirth Classes Attend childbirth classes with your partner if you are able. Classes prepare you and help you to understand what happens during labor and delivery, and they help you and your partner to bond. There are even some classes that are only for new fathers. Classes also teach you and your partner:  Various relaxation techniques.  How to work with her labor pains.  How to focus during labor and delivery.  What should I know about labor and delivery? Many fathers want to be present while their partner is going through labor and delivery. You may:  Be asked to time the contractions, massage your partner's back, and breathe with her during the contractions.  Get to see and enjoy the excitement of your baby being born, and you may even be able to cut your baby's umbilical cord. If you feel like you might faint or you are uncomfortable, ask someone to help you.  Need to leave the room if a problem develops during labor or delivery.  A cesarean delivery, or C-section,  is a procedure that may be used to deliver the baby. It is done through an incision in the abdomen and the uterus. A cesarean delivery may be scheduled or it may be an emergency procedure during labor and delivery. Most hospitals allow the father to be in the room for a cesarean delivery unless it is an emergency. Recovery from a cesarean delivery usually requires more help from the father. What happens after delivery? After your baby is born, your partner will go through many changes again. These changes could last a few months or longer. Postpartum Depression Your partner may take awhile to regain her strength. She may also have feelings of sadness (postpartum blues or postpartum depression). If your partner is acting unusually sad or depressed, talk with your health care provider right away. This can be a serious medical condition that requires treatment. Breastfeeding Your partner may decide to breastfeed the baby. This helps with bonding between the mother and the baby, and breast milk is the best nutrition for your baby. You can feel included by burping the baby and bottle-feeding the baby with breast milk that was collected from the mother. This allows your partner to rest and helps you to bond with your baby. Sexual Activity It may take a few months for your partner's body to heal and be ready for sexual intercourse again. This may take longer after a cesarean delivery. If  you have any questions about having sexual intercourse or if it is painful for your partner, talk with your health care provider. It is possible for breastfeeding mothers to become pregnant even if they are not having menstrual periods. Use birth control (contraception) unless you and your partner would like to become pregnant again. What should I remember? Fatherhood and having a baby is an ongoing learning experience. It is common to be anxious, concerned, or afraid that you may not be taking care of your newborn baby  properly. It is important to talk with your partner and your health care provider if you are worried or have any questions. This information is not intended to replace advice given to you by your health care provider. Make sure you discuss any questions you have with your health care provider. Document Released: 04/29/2008 Document Revised: 04/15/2016 Document Reviewed: 07/29/2014 Elsevier Interactive Patient Education  2017 [place] Before your baby arrives it is important to:  Have all of the supplies that you will need to care for your baby.  Know where to go if there is an emergency.  Discuss the baby's arrival with other family members.  What supplies will I need?  It is recommended that you have the following supplies: Large Items  Crib.  Crib mattress.  Rear-facing infant car seat. If possible, have a trained professional check to make sure that it is installed correctly.  Feeding  6-8 bottles that are 4-5 oz in size.  6-8 nipples.  Bottle brush.  Sterilizer, or a large pan or kettle with a lid.  A way to boil and cool water.  If you will be breastfeeding: ? Breast pump. ? Nipple cream. ? Nursing bra. ? Breast pads. ? Breast shields.  If you will be formula feeding: ? Formula. ? Measuring cups. ? Measuring spoons.  Bathing  Mild baby soap and baby shampoo.  Petroleum jelly.  Soft cloth towel and washcloth.  Hooded towel.  Cotton balls.  Bath basin.  Other Supplies  Rectal thermometer.  Bulb syringe.  Baby wipes or washcloths for diaper changes.  Diaper bag.  Changing pad.  Clothing, including one-piece outfits and pajamas.  Baby nail clippers.  Receiving blankets.  Mattress pad and sheets for the crib.  Night-light for the baby's room.  Baby monitor.  2 or 3 pacifiers.  Either 24-36 cloth diapers and waterproof diaper covers or a box of disposable diapers. You may need to use as many as 10-12  diapers per day.  How do I prepare for an emergency? Prepare for an emergency by:  Knowing how to get to the nearest hospital.  Listing the phone numbers of your baby's health care providers near your home phone and in your cell phone.  How do I prepare my family?  Decide how to handle visitors.  If you have other children: ? Talk with them about the baby coming home. Ask them how they feel about it. ? Read a book together about being a new big brother or sister. ? Find ways to let them help you prepare for the new baby. ? Have someone ready to care for them while you are in the hospital. This information is not intended to replace advice given to you by your health care provider. Make sure you discuss any questions you have with your health care provider. Document Released: 10/24/2008 Document Revised: 04/18/2016 Document Reviewed: 10/19/2014 Elsevier Interactive Patient Education  2018 [employer].  Anemia Anemia is  a condition in which you do not have enough red blood cells or hemoglobin. Hemoglobin is a substance in red blood cells that carries oxygen. When you do not have enough red blood cells or hemoglobin (are anemic), your body cannot get enough oxygen and your organs may not work properly. As a result, you may feel very tired or have other problems. What are the causes? Common causes of anemia include:  Excessive bleeding. Anemia can be caused by excessive bleeding inside or outside the body, including bleeding from the intestine or from periods in women.  Poor nutrition.  Long-lasting (chronic) kidney, thyroid, and liver disease.  Bone marrow disorders.  Cancer and treatments for cancer.  HIV (human immunodeficiency virus) and AIDS (acquired immunodeficiency syndrome).  Treatments for HIV and AIDS.  Spleen problems.  Blood disorders.  Infections, medicines, and autoimmune disorders that destroy red blood cells.  What are the signs or symptoms? Symptoms of  this condition include:  Minor weakness.  Dizziness.  Headache.  Feeling heartbeats that are irregular or faster than normal (palpitations).  Shortness of breath, especially with exercise.  Paleness.  Cold sensitivity.  Indigestion.  Nausea.  Difficulty sleeping.  Difficulty concentrating.  Symptoms may occur suddenly or develop slowly. If your anemia is mild, you may not have symptoms. How is this diagnosed? This condition is diagnosed based on:  Blood tests.  Your medical history.  A physical exam.  Bone marrow biopsy.  Your health care provider may also check your stool (feces) for blood and may do additional testing to look for the cause of your bleeding. You may also have other tests, including:  Imaging tests, such as a CT scan or MRI.  Endoscopy.  Colonoscopy.  How is this treated? Treatment for this condition depends on the cause. If you continue to lose a lot of blood, you may need to be treated at a hospital. Treatment may include:  Taking supplements of iron, vitamin X10, or folic acid.  Taking a hormone medicine (erythropoietin) that can help to stimulate red blood cell growth.  Having a blood transfusion. This may be needed if you lose a lot of blood.  Making changes to your diet.  Having surgery to remove your spleen.  Follow these instructions at home:  Take over-the-counter and prescription medicines only as told by your health care provider.  Take supplements only as told by your health care provider.  Follow any diet instructions that you were given.  Keep all follow-up visits as told by your health care provider. This is important. Contact a health care provider if:  You develop new bleeding anywhere in the body. Get help right away if:  You are very weak.  You are short of breath.  You have pain in your abdomen or chest.  You are dizzy or feel faint.  You have trouble concentrating.  You have bloody or black, tarry  stools.  You vomit repeatedly or you vomit up blood. Summary  Anemia is a condition in which you do not have enough red blood cells or enough of a substance in your red blood cells that carries oxygen (hemoglobin).  Symptoms may occur suddenly or develop slowly.  If your anemia is mild, you may not have symptoms.  This condition is diagnosed with blood tests as well as a medical history and physical exam. Other tests may be needed.  Treatment for this condition depends on the cause of the anemia. This information is not intended to replace advice given to  you by your health care provider. Make sure you discuss any questions you have with your health care provider. Document Released: 12/19/2004 Document Revised: 12/13/2016 Document Reviewed: 12/13/2016 Elsevier Interactive Patient Education  [employer].

## 2018-04-16 NOTE — Progress Notes (Signed)
Patient reports good fetal movement, complains of some soreness in ribs.

## 2018-04-30 ENCOUNTER — Encounter: Payer: Self-pay | Admitting: Certified Nurse Midwife

## 2018-04-30 ENCOUNTER — Ambulatory Visit (HOSPITAL_COMMUNITY)
Admission: RE | Admit: 2018-04-30 | Discharge: 2018-04-30 | Disposition: A | Discharge status: Home / Self Care | Payer: Medicaid Other | Source: Ambulatory Visit | Attending: Certified Nurse Midwife | Admitting: Certified Nurse Midwife

## 2018-04-30 ENCOUNTER — Ambulatory Visit (INDEPENDENT_AMBULATORY_CARE_PROVIDER_SITE_OTHER): Payer: Medicaid Other | Admitting: Certified Nurse Midwife

## 2018-04-30 ENCOUNTER — Other Ambulatory Visit: Payer: Self-pay | Admitting: Certified Nurse Midwife

## 2018-04-30 ENCOUNTER — Other Ambulatory Visit (HOSPITAL_COMMUNITY)
Admission: RE | Admit: 2018-04-30 | Discharge: 2018-04-30 | Disposition: A | Discharge status: Home / Self Care | Payer: Medicaid Other | Source: Ambulatory Visit | Attending: Certified Nurse Midwife | Admitting: Certified Nurse Midwife

## 2018-04-30 VITALS — BP 124/80 | HR 92 | Wt 250.0 lb

## 2018-04-30 DIAGNOSIS — O99213 Obesity complicating pregnancy, third trimester: Secondary | ICD-10-CM | POA: Diagnosis not present

## 2018-04-30 DIAGNOSIS — Z3A36 36 weeks gestation of pregnancy: Secondary | ICD-10-CM

## 2018-04-30 DIAGNOSIS — Z3400 Encounter for supervision of normal first pregnancy, unspecified trimester: Secondary | ICD-10-CM | POA: Diagnosis not present

## 2018-04-30 DIAGNOSIS — Z362 Encounter for other antenatal screening follow-up: Secondary | ICD-10-CM | POA: Diagnosis present

## 2018-04-30 NOTE — Progress Notes (Signed)
   PRENATAL VISIT NOTE  Subjective:  Debra Reilly is a 22 y.o. G1P0 at 7665w3d being seen today for ongoing prenatal care.  She is currently monitored for the following issues for this low-risk pregnancy and has Supervision of normal first pregnancy, antepartum; Obesity affecting pregnancy, antepartum; Obesity (BMI 30.0-34.9); Abnormal cervical Papanicolaou smear affecting pregnancy, antepartum; LGSIL on Pap smear of cervix; and Anemia in pregnancy on their problem list.  Patient reports no complaints.  Contractions: Not present. Vag. Bleeding: None.  Movement: Present. Denies leaking of fluid.   The following portions of the patient's history were reviewed and updated as appropriate: allergies, current medications, past family history, past medical history, past social history, past surgical history and problem list. Problem list updated.  Objective:   Vitals:   04/30/18 1502  BP: 124/80  Pulse: 92  Weight: 250 lb (113.4 kg)    Fetal Status: Fetal Heart Rate (bpm): 141; doppler Fundal Height: 35 cm Movement: Present  Presentation: Vertex  General:  Alert, oriented and cooperative. Patient is in no acute distress.  Skin: Skin is warm and dry. No rash noted.   Cardiovascular: Normal heart rate noted  Respiratory: Normal respiratory effort, no problems with respiration noted  Abdomen: Soft, gravid, appropriate for gestational age.  Pain/Pressure: Absent     Pelvic: Cervical exam performed Dilation: Fingertip Effacement (%): 0 Station: -3  Extremities: Normal range of motion.  Edema: None  Mental Status: Normal mood and affect. Normal behavior. Normal judgment and thought content.   Assessment and Plan:  Pregnancy: G1P0 at 6665w3d  1. Supervision of normal first pregnancy, antepartum     Doing well.   - Strep Gp B NAA - Cervicovaginal ancillary only  Preterm labor symptoms and general obstetric precautions including but not limited to vaginal bleeding, contractions, leaking of fluid  and fetal movement were reviewed in detail with the patient. Please refer to After Visit Summary for other counseling recommendations.  Return in about 1 week (around 05/07/2018) for ROB.  No future appointments.  Roe Coombsachelle A Farhad Burleson, CNM

## 2018-04-30 NOTE — Progress Notes (Signed)
ROB GBS 

## 2018-05-01 ENCOUNTER — Other Ambulatory Visit: Payer: Self-pay | Admitting: Certified Nurse Midwife

## 2018-05-01 LAB — CERVICOVAGINAL ANCILLARY ONLY
Chlamydia: NEGATIVE
NEISSERIA GONORRHEA: NEGATIVE

## 2018-05-03 LAB — STREP GP B NAA: Strep Gp B NAA: NEGATIVE

## 2018-05-04 ENCOUNTER — Other Ambulatory Visit: Payer: Self-pay | Admitting: Certified Nurse Midwife

## 2018-05-04 DIAGNOSIS — Z3400 Encounter for supervision of normal first pregnancy, unspecified trimester: Secondary | ICD-10-CM

## 2018-05-07 ENCOUNTER — Ambulatory Visit (INDEPENDENT_AMBULATORY_CARE_PROVIDER_SITE_OTHER): Payer: Medicaid Other | Admitting: Certified Nurse Midwife

## 2018-05-07 ENCOUNTER — Encounter: Payer: Self-pay | Admitting: Certified Nurse Midwife

## 2018-05-07 VITALS — BP 113/74 | HR 90 | Wt 247.0 lb

## 2018-05-07 DIAGNOSIS — E669 Obesity, unspecified: Secondary | ICD-10-CM

## 2018-05-07 DIAGNOSIS — O99210 Obesity complicating pregnancy, unspecified trimester: Secondary | ICD-10-CM

## 2018-05-07 DIAGNOSIS — O99013 Anemia complicating pregnancy, third trimester: Secondary | ICD-10-CM

## 2018-05-07 DIAGNOSIS — Z3403 Encounter for supervision of normal first pregnancy, third trimester: Secondary | ICD-10-CM

## 2018-05-07 DIAGNOSIS — Z3400 Encounter for supervision of normal first pregnancy, unspecified trimester: Secondary | ICD-10-CM

## 2018-05-07 DIAGNOSIS — O99213 Obesity complicating pregnancy, third trimester: Secondary | ICD-10-CM

## 2018-05-07 NOTE — Progress Notes (Signed)
   PRENATAL VISIT NOTE  Subjective:  Debra Reilly is a 22 y.o. G1P0 at 551w3d being seen today for ongoing prenatal care.  She is currently monitored for the following issues for this low-risk pregnancy and has Supervision of normal first pregnancy, antepartum; Obesity affecting pregnancy, antepartum; Obesity (BMI 30.0-34.9); Abnormal cervical Papanicolaou smear affecting pregnancy, antepartum; LGSIL on Pap smear of cervix; and Anemia in pregnancy on their problem list.  Patient reports no complaints.  Contractions: Not present. Vag. Bleeding: None.  Movement: Present. Denies leaking of fluid.   The following portions of the patient's history were reviewed and updated as appropriate: allergies, current medications, past family history, past medical history, past social history, past surgical history and problem list. Problem list updated.  Objective:   Vitals:   05/07/18 1344  BP: 113/74  Pulse: 90  Weight: 247 lb (112 kg)    Fetal Status: Fetal Heart Rate (bpm): ; doppler   Movement: Present     General:  Alert, oriented and cooperative. Patient is in no acute distress.  Skin: Skin is warm and dry. No rash noted.   Cardiovascular: Normal heart rate noted  Respiratory: Normal respiratory effort, no problems with respiration noted  Abdomen: Soft, gravid, appropriate for gestational age.  Pain/Pressure: Absent     Pelvic: Cervical exam deferred        Extremities: Normal range of motion.  Edema: None  Mental Status: Normal mood and affect. Normal behavior. Normal judgment and thought content.   Assessment and Plan:  Pregnancy: G1P0 at 211w3d  1. Supervision of normal first pregnancy, antepartum    Doing well  2. Anemia during pregnancy in third trimester     Taking iron supplementation  3. Obesity affecting pregnancy, antepartum     17 lb weight gain this pregnancy  Preterm labor symptoms and general obstetric precautions including but not limited to vaginal bleeding,  contractions, leaking of fluid and fetal movement were reviewed in detail with the patient. Please refer to After Visit Summary for other counseling recommendations.  Return in about 1 week (around 05/14/2018) for ROB.  No future appointments.  Roe Coombsachelle A Torre Schaumburg, CNM

## 2018-05-07 NOTE — Progress Notes (Signed)
Patient reports good fetal movement, denies pain. 

## 2018-05-14 ENCOUNTER — Encounter: Payer: Self-pay | Admitting: Certified Nurse Midwife

## 2018-05-14 ENCOUNTER — Ambulatory Visit (INDEPENDENT_AMBULATORY_CARE_PROVIDER_SITE_OTHER): Payer: Medicaid Other | Admitting: Certified Nurse Midwife

## 2018-05-14 VITALS — BP 119/79 | HR 79 | Wt 247.0 lb

## 2018-05-14 DIAGNOSIS — O99013 Anemia complicating pregnancy, third trimester: Secondary | ICD-10-CM

## 2018-05-14 DIAGNOSIS — Z3400 Encounter for supervision of normal first pregnancy, unspecified trimester: Secondary | ICD-10-CM

## 2018-05-14 DIAGNOSIS — O99210 Obesity complicating pregnancy, unspecified trimester: Secondary | ICD-10-CM

## 2018-05-14 DIAGNOSIS — Z3403 Encounter for supervision of normal first pregnancy, third trimester: Secondary | ICD-10-CM

## 2018-05-14 NOTE — Patient Instructions (Signed)
AREA PEDIATRIC/FAMILY PRACTICE PHYSICIANS  [facility] [address], [address] [place], [place]  [address] Phone - [phone]   Fax - [phone]  [facility] [address] [place], [place] [address] Phone - [phone]   Fax - [phone]  [facility] [address] [place], [place]  [address] Phone - [phone]   Fax - [phone]  [facility] [address] [place], [place]  [address] Phone - [phone]   Fax - [phone]  [facility] [address] [place], [place]  [address] Phone - [phone]   Fax - [phone]  [facility] [address] [place], [place]  [address] Phone - [phone]   Fax - [phone]  [facility] [address] [place], [place]  [address] Phone - [phone]   Fax - [phone]  [facility] [address] [place], [place]  [address] Phone - [phone]   Fax - [phone]  [facility] [address] [place], [place]  [address] Phone - [phone]   Fax - [phone] [facility] [address] [place], [place]  [address] Phone - [phone]   Fax - [phone]  [facility] [address] [place], [place]  [address] Phone - [phone]   Fax - [phone]  [facility] [address] [place], [place]  [address] Phone - [phone]   Fax - [phone]  [facility] [address], [address] [place], [place]  [address] Phone - [phone]   Fax - [phone]  [facility] GOSR[facility] [address] [place], [place]  [address] Phone - [phone]  [facility] [address] [place], [place]  [address] Phone - [phone]   Fax - [phone]  [facility] [address] [place], [place]  [address] Phone - [phone]   Fax - [phone]  [facility] [address] [place], [place]  [address] Phone - [phone]   Fax - [phone]  [facility] [address] [place], [place]  [address] Phone - [phone]   Fax - [phone]   [facility] [address] [place], [place]  [address] Phone - [phone]   Fax - [phone]  [facility] [address] [place], [place]  [address] Phone - [phone]   Fax - [phone]  [facility] R[facility] [address] [place], [place]  [address] Phone - [phone]   Fax - [phone]  [facility] [address] [place], [place]  [address] Phone - [phone]   Fax - [phone]  [facility] [address] [place], [place]  [address] Phone - [phone]   Fax - [phone] [facility] [address] [place], [place]  [address] Phone - [phone]   Fax - [phone]  [facility] [address] [place], [place]  [address] Phone - [phone]   Fax - [phone]  [facility] [address] [place],   [address] Phone - [phone]   Fax - [phone]  [facility] Charlene Flemming MD [address] [place]  [address] Phone [phone]  Fax [phone]   

## 2018-05-14 NOTE — Progress Notes (Signed)
   PRENATAL VISIT NOTE  Subjective:  Debra Reilly is a 22 y.o. G1P0 at 5362w3d being seen today for ongoing prenatal care.  She is currently monitored for the following issues for this low-risk pregnancy and has Supervision of normal first pregnancy, antepartum; Obesity affecting pregnancy, antepartum; Obesity (BMI 30.0-34.9); Abnormal cervical Papanicolaou smear affecting pregnancy, antepartum; LGSIL on Pap smear of cervix; and Anemia in pregnancy on their problem list.  Patient reports no bleeding, no leaking, occasional contractions and contractions last night that went away after a few hours.  Contractions: Irregular. Vag. Bleeding: None.  Movement: Present. Denies leaking of fluid.   The following portions of the patient's history were reviewed and updated as appropriate: allergies, current medications, past family history, past medical history, past social history, past surgical history and problem list. Problem list updated.  Objective:   Vitals:   05/14/18 1345  BP: 119/79  Pulse: 79  Weight: 247 lb (112 kg)    Fetal Status: Fetal Heart Rate (bpm): 158; doppler Fundal Height: 35 cm Movement: Present  Presentation: Vertex  General:  Alert, oriented and cooperative. Patient is in no acute distress.  Skin: Skin is warm and dry. No rash noted.   Cardiovascular: Normal heart rate noted  Respiratory: Normal respiratory effort, no problems with respiration noted  Abdomen: Soft, gravid, appropriate for gestational age.  Pain/Pressure: Absent     Pelvic: Cervical exam performed Dilation: 2 Effacement (%): 50 Station: -3  Extremities: Normal range of motion.     Mental Status: Normal mood and affect. Normal behavior. Normal judgment and thought content.   Assessment and Plan:  Pregnancy: G1P0 at 1162w3d  1. Supervision of normal first pregnancy, antepartum     Doing well  2. Obesity affecting pregnancy, antepartum     17 lb weight gain this pregnancy  3. Anemia during pregnancy in  third trimester     Taking Iron  Term labor symptoms and general obstetric precautions including but not limited to vaginal bleeding, contractions, leaking of fluid and fetal movement were reviewed in detail with the patient. Please refer to After Visit Summary for other counseling recommendations.  Return in about 1 week (around 05/21/2018) for ROB.  No future appointments.  Roe Coombsachelle A Denney, CNM

## 2018-05-16 ENCOUNTER — Inpatient Hospital Stay (HOSPITAL_COMMUNITY)
Admission: AD | Admit: 2018-05-16 | Discharge: 2018-05-19 | DRG: 807 | Disposition: A | Discharge status: Home / Self Care | Payer: Medicaid Other | Attending: Obstetrics and Gynecology | Admitting: Obstetrics and Gynecology

## 2018-05-16 ENCOUNTER — Encounter (HOSPITAL_COMMUNITY): Payer: Self-pay | Admitting: *Deleted

## 2018-05-16 DIAGNOSIS — O4290 Premature rupture of membranes, unspecified as to length of time between rupture and onset of labor, unspecified weeks of gestation: Secondary | ICD-10-CM | POA: Diagnosis present

## 2018-05-16 DIAGNOSIS — O9902 Anemia complicating childbirth: Secondary | ICD-10-CM | POA: Diagnosis present

## 2018-05-16 DIAGNOSIS — D649 Anemia, unspecified: Secondary | ICD-10-CM | POA: Diagnosis present

## 2018-05-16 DIAGNOSIS — O4202 Full-term premature rupture of membranes, onset of labor within 24 hours of rupture: Secondary | ICD-10-CM | POA: Diagnosis not present

## 2018-05-16 DIAGNOSIS — Z3A38 38 weeks gestation of pregnancy: Secondary | ICD-10-CM | POA: Diagnosis not present

## 2018-05-16 DIAGNOSIS — O99214 Obesity complicating childbirth: Secondary | ICD-10-CM | POA: Diagnosis present

## 2018-05-16 DIAGNOSIS — R87619 Unspecified abnormal cytological findings in specimens from cervix uteri: Secondary | ICD-10-CM

## 2018-05-16 DIAGNOSIS — O99210 Obesity complicating pregnancy, unspecified trimester: Secondary | ICD-10-CM

## 2018-05-16 DIAGNOSIS — O4292 Full-term premature rupture of membranes, unspecified as to length of time between rupture and onset of labor: Principal | ICD-10-CM | POA: Diagnosis present

## 2018-05-16 DIAGNOSIS — O3440 Maternal care for other abnormalities of cervix, unspecified trimester: Secondary | ICD-10-CM

## 2018-05-16 HISTORY — DX: Other specified health status: Z78.9

## 2018-05-16 LAB — TYPE AND SCREEN
ABO/RH(D): O POS
Antibody Screen: NEGATIVE

## 2018-05-16 LAB — POCT FERN TEST

## 2018-05-16 LAB — CBC
HCT: 36.1 % (ref 36.0–46.0)
Hemoglobin: 11.8 g/dL — ABNORMAL LOW (ref 12.0–15.0)
MCH: 31.2 pg (ref 26.0–34.0)
MCHC: 32.7 g/dL (ref 30.0–36.0)
MCV: 95.5 fL (ref 78.0–100.0)
Platelets: 288 10*3/uL (ref 150–400)
RBC: 3.78 MIL/uL — ABNORMAL LOW (ref 3.87–5.11)
RDW: 13.4 % (ref 11.5–15.5)
WBC: 15.3 10*3/uL — ABNORMAL HIGH (ref 4.0–10.5)

## 2018-05-16 MED ORDER — OXYCODONE-ACETAMINOPHEN 5-325 MG PO TABS: 2.0000 | ORAL_TABLET | ORAL | Status: DC | PRN

## 2018-05-16 MED ORDER — ACETAMINOPHEN 325 MG PO TABS: 650.0000 mg | ORAL_TABLET | ORAL | Status: DC | PRN

## 2018-05-16 MED ORDER — FENTANYL CITRATE (PF) 100 MCG/2ML IJ SOLN: 100.0000 ug | INTRAMUSCULAR | Status: DC | PRN

## 2018-05-16 MED ORDER — LACTATED RINGERS IV SOLN: INTRAVENOUS | Status: DC

## 2018-05-16 MED ORDER — OXYCODONE-ACETAMINOPHEN 5-325 MG PO TABS: 1.0000 | ORAL_TABLET | ORAL | Status: DC | PRN

## 2018-05-16 MED ORDER — LACTATED RINGERS IV SOLN: 500.0000 mL | INTRAVENOUS | Status: DC | PRN

## 2018-05-16 MED ORDER — ONDANSETRON HCL 4 MG/2ML IJ SOLN: 4.0000 mg | Freq: Four times a day (QID) | INTRAMUSCULAR | Status: DC | PRN

## 2018-05-16 MED ORDER — OXYTOCIN 40 UNITS IN LACTATED RINGERS INFUSION - SIMPLE MED
2.5000 [IU]/h | INTRAVENOUS | Status: DC
  Filled 2018-05-16: qty 1000

## 2018-05-16 MED ORDER — OXYTOCIN BOLUS FROM INFUSION
500.0000 mL | Freq: Once | INTRAVENOUS | Status: AC
  Administered 2018-05-17: 500 mL via INTRAVENOUS

## 2018-05-16 MED ORDER — SOD CITRATE-CITRIC ACID 500-334 MG/5ML PO SOLN: 30.0000 mL | ORAL | Status: DC | PRN

## 2018-05-16 MED ORDER — LIDOCAINE HCL (PF) 1 % IJ SOLN
30.0000 mL | INTRAMUSCULAR | Status: DC | PRN
  Filled 2018-05-16: qty 30

## 2018-05-16 NOTE — H&P (Addendum)
LABOR AND DELIVERY ADMISSION HISTORY AND PHYSICAL NOTE  Ronie Fleeger is a 22 y.o. female G1P0 with IUP at [redacted]w[redacted]d by LMP presenting for SROM at 81- clear fluid.  She reports positive fetal movement. She denies vaginal bleeding.She denies contractions reports some occasional cramping.   Prenatal History/Complications: PNC at [facility]  Pregnancy complications:  - Obesity affecting pregnancy, antepartum  -LGSIL on pap smear  - Anemia in pregnancy   Past Medical History: Past Medical History:  Diagnosis Date  . Anemia   . Medical history non-contributory     Past Surgical History: History reviewed. No pertinent surgical history.  Obstetrical History: OB History    Gravida  1   Para      Term      Preterm      AB      Living        SAB      TAB      Ectopic      Multiple      Live Births              Social History: Social History   Socioeconomic History  . Marital status: Single    Spouse name: Not on file  . Number of children: Not on file  . Years of education: Not on file  . Highest education level: Not on file  Occupational History  . Not on file  Social Needs  . Financial resource strain: Not on file  . Food insecurity:    Worry: Not on file    Inability: Not on file  . Transportation needs:    Medical: Not on file    Non-medical: Not on file  Tobacco Use  . Smoking status: Never Smoker  . Smokeless tobacco: Never Used  Substance and Sexual Activity  . Alcohol use: No    Frequency: Never  . Drug use: Yes    Types: Marijuana    Comment: last used prior to pregnancy  . Sexual activity: Yes    Birth control/protection: None  Lifestyle  . Physical activity:    Days per week: Not on file    Minutes per session: Not on file  . Stress: Not on file  Relationships  . Social connections:    Talks on phone: Not on file    Gets together: Not on file    Attends religious service: Not on file    Active member of club or organization:  Not on file    Attends meetings of clubs or organizations: Not on file    Relationship status: Not on file  Other Topics Concern  . Not on file  Social History Narrative  . Not on file    Family History: Family History  Problem Relation Age of Onset  . Diabetes Maternal Aunt   . Hypertension Paternal Uncle     Allergies: No Known Allergies  Medications Prior to Admission  Medication Sig Dispense Refill Last Dose  . Prenat-FeAsp-Meth-FA-DHA w/o A (PRENATE PIXIE) 10-0.6-0.4-200 MG CAPS Take 1 tablet by mouth daily. 30 capsule 12 05/15/2018 at Unknown time  . Prenatal-DSS-FeCb-FeGl-FA (CITRANATAL BLOOM) 90-1 MG TABS Take 1 tablet by mouth daily. 30 tablet 12 05/15/2018 at Unknown time  . ferrous sulfate 325 (65 FE) MG tablet Take 325 mg by mouth daily with breakfast.   More than a month at Unknown time     Review of Systems  All systems reviewed and negative except as stated in HPI  Physical Exam Blood pressure Marland Kitchen)  137/96, pulse 82, temperature 98.1 F (36.7 C), temperature source Oral, resp. rate 16, height 5\' 6"  (1.676 m), weight 250 lb (113.4 kg), last menstrual period 08/18/2017. General appearance: alert, cooperative and no distress Lungs: clear to auscultation bilaterally Heart: regular rate and rhythm Abdomen: soft, non-tender; bowel sounds normal Extremities: No calf swelling or tenderness Presentation: cephalic Fetal monitoring: 140/ moderate/ + accelerations/ no decels  Uterine activity: 2-4/ mild by palpation  Dilation: 2 Effacement (%): 60 Station: -3 Exam by:: Karl Itoiana Ansah-Mensah, rnc  Prenatal labs: ABO, Rh: O/Positive/-- (12/19 1306) Antibody: Negative (12/19 1306) Rubella: 1.76 (12/19 1306) RPR: Non Reactive (04/11 1112)  HBsAg: Negative (12/19 1306)  HIV: Non Reactive (04/11 1112)  GC/Chlamydia: Negative (6/06) GBS: Negative (06/06 1546)  2 hr Glucola: 70-77-61 Genetic screening:  Negative  Anatomy US: Normal female   Clinic [facility] Prenatal Labs   Dating LMP Blood type: O/Positive/-- (12/19 1306)   Genetic Screen  AFP:  neg      NIPS: NEG Antibody:Negative (12/19 1306)  Anatomic US Normal @19  wks Rubella: 1.76 (12/19 1306)  GTT  Third trimester: WNL RPR: Non Reactive (04/11 1112)   Flu vaccine Declined 11-12-17 HBsAg: Negative (12/19 1306)   TDaP vaccine Declined 04-16-18                                       Rhogam:n/a O+ HIV:  Non-reactive  Baby Food Breast                                  WUJ:WJXBJYNWGBS:Negative (06/06 1546)  Contraception Declined 05-07-18 Pap: LSIL on 11/12/17  Circumcision N/a    Pediatrician [facility] CF: Neg  Support Person Fob, mother SMA: neg fragile x: neg  Prenatal Classes no Hgb electrophoresis: normal    Prenatal Transfer Tool  Maternal Diabetes: No Genetic Screening: Normal Maternal Ultrasounds/Referrals: Normal Fetal Ultrasounds or other Referrals:  None Maternal Substance Abuse:  No Significant Maternal Medications:  None Significant Maternal Lab Results: Lab values include: Group B Strep negative  Results for orders placed or performed during the hospital encounter of 05/16/18 (from the past 24 hour(s))  POCT fern test   Collection Time: 05/16/18  9:22 PM  Result Value Ref Range   POCT Fern Test    CBC   Collection Time: 05/16/18  9:40 PM  Result Value Ref Range   WBC 15.3 (H) 4.0 - 10.5 K/uL   RBC 3.78 (L) 3.87 - 5.11 MIL/uL   Hemoglobin 11.8 (L) 12.0 - 15.0 g/dL   HCT 29.536.1 62.136.0 - 30.846.0 %   MCV 95.5 78.0 - 100.0 fL   MCH 31.2 26.0 - 34.0 pg   MCHC 32.7 30.0 - 36.0 g/dL   RDW 65.713.4 84.611.5 - 96.215.5 %   Platelets 288 150 - 400 K/uL    Patient Active Problem List   Diagnosis Date Noted  . Normal labor 05/16/2018  . Anemia in pregnancy 03/09/2018  . Obesity affecting pregnancy, antepartum 11/20/2017  . Obesity (BMI 30.0-34.9) 11/20/2017  . Abnormal cervical Papanicolaou smear affecting pregnancy, antepartum 11/20/2017  . LGSIL on Pap smear of cervix 11/20/2017  . Supervision of normal  first pregnancy, antepartum 11/12/2017    Assessment: Joretta BachelorJamiya Albanese is a 22 y.o. G1P0 at 8153w5d here for SROM @2000 .   #Labor: Recheck cervix in 2 hours. Will augment if no  cervical change or no contractions  #Pain: Pain medication ordered PRN- patient plans no epidural  #FWB: Cat I #ID:  GBS neg  #MOF: Breast  #MOC:Declined contraception  #Circ:  N/A  Sharyon Cable, CNM 05/16/2018, 11:04 PM

## 2018-05-16 NOTE — MAU Note (Addendum)
Leaking clear fld for about that will not stop. 2cm last sve. No pain. Good FM

## 2018-05-16 NOTE — Progress Notes (Deleted)
LABOR AND DELIVERY ADMISSION HISTORY AND PHYSICAL NOTE  Lokelani Lutes is a 22 y.o. female G1P0 with IUP at [redacted]w[redacted]d by LMP presenting for SROM at 2000- clear fluid.  She reports positive fetal movement. She denies vaginal bleeding.She denies contractions reports some occasional cramping.   Prenatal History/Complications: PNC at [facility]  Pregnancy complications:  - Obesity affecting pregnancy, antepartum  -LGSIL on pap smear  - Anemia in pregnancy   Past Medical History: Past Medical History:  Diagnosis Date  . Anemia   . Medical history non-contributory     Past Surgical History: History reviewed. No pertinent surgical history.  Obstetrical History: OB History    Gravida  1   Para      Term      Preterm      AB      Living        SAB      TAB      Ectopic      Multiple      Live Births              Social History: Social History   Socioeconomic History  . Marital status: Single    Spouse name: Not on file  . Number of children: Not on file  . Years of education: Not on file  . Highest education level: Not on file  Occupational History  . Not on file  Social Needs  . Financial resource strain: Not on file  . Food insecurity:    Worry: Not on file    Inability: Not on file  . Transportation needs:    Medical: Not on file    Non-medical: Not on file  Tobacco Use  . Smoking status: Never Smoker  . Smokeless tobacco: Never Used  Substance and Sexual Activity  . Alcohol use: No    Frequency: Never  . Drug use: Yes    Types: Marijuana    Comment: last used prior to pregnancy  . Sexual activity: Yes    Birth control/protection: None  Lifestyle  . Physical activity:    Days per week: Not on file    Minutes per session: Not on file  . Stress: Not on file  Relationships  . Social connections:    Talks on phone: Not on file    Gets together: Not on file    Attends religious service: Not on file    Active member of club or organization:  Not on file    Attends meetings of clubs or organizations: Not on file    Relationship status: Not on file  Other Topics Concern  . Not on file  Social History Narrative  . Not on file    Family History: Family History  Problem Relation Age of Onset  . Diabetes Maternal Aunt   . Hypertension Paternal Uncle     Allergies: No Known Allergies  Medications Prior to Admission  Medication Sig Dispense Refill Last Dose  . Prenat-FeAsp-Meth-FA-DHA w/o A (PRENATE PIXIE) 10-0.6-0.4-200 MG CAPS Take 1 tablet by mouth daily. 30 capsule 12 05/15/2018 at Unknown time  . Prenatal-DSS-FeCb-FeGl-FA (CITRANATAL BLOOM) 90-1 MG TABS Take 1 tablet by mouth daily. 30 tablet 12 05/15/2018 at Unknown time  . ferrous sulfate 325 (65 FE) MG tablet Take 325 mg by mouth daily with breakfast.   More than a month at Unknown time     Review of Systems  All systems reviewed and negative except as stated in HPI  Physical Exam Blood pressure Marland Kitchen)  137/96, pulse 82, temperature 98.1 F (36.7 C), temperature source Oral, resp. rate 16, height 5\' 6"  (1.676 m), weight 250 lb (113.4 kg), last menstrual period 08/18/2017. General appearance: alert, cooperative and no distress Lungs: clear to auscultation bilaterally Heart: regular rate and rhythm Abdomen: soft, non-tender; bowel sounds normal Extremities: No calf swelling or tenderness Presentation: cephalic Fetal monitoring: 140/ moderate/ + accelerations/ no decels  Uterine activity: 2-4/ mild by palpation  Dilation: 2 Effacement (%): 60 Station: -3 Exam by:: Karl Itoiana Ansah-Mensah, rnc  Prenatal labs: ABO, Rh: O/Positive/-- (12/19 1306) Antibody: Negative (12/19 1306) Rubella: 1.76 (12/19 1306) RPR: Non Reactive (04/11 1112)  HBsAg: Negative (12/19 1306)  HIV: Non Reactive (04/11 1112)  GC/Chlamydia: Negative (6/06) GBS: Negative (06/06 1546)  2 hr Glucola: 70-77-61 Genetic screening:  Negative  Anatomy US: Normal female   Clinic [facility] Prenatal Labs   Dating LMP Blood type: O/Positive/-- (12/19 1306)   Genetic Screen  AFP:  neg      NIPS: NEG Antibody:Negative (12/19 1306)  Anatomic US Normal @19  wks Rubella: 1.76 (12/19 1306)  GTT  Third trimester: WNL RPR: Non Reactive (04/11 1112)   Flu vaccine Declined 11-12-17 HBsAg: Negative (12/19 1306)   TDaP vaccine Declined 04-16-18                                       Rhogam:n/a O+ HIV:  Non-reactive  Baby Food Breast                                  WUJ:WJXBJYNWGBS:Negative (06/06 1546)  Contraception Declined 05-07-18 Pap: LSIL on 11/12/17  Circumcision N/a    Pediatrician [facility] CF: Neg  Support Person Fob, mother SMA: neg fragile x: neg  Prenatal Classes no Hgb electrophoresis: normal    Prenatal Transfer Tool  Maternal Diabetes: No Genetic Screening: Normal Maternal Ultrasounds/Referrals: Normal Fetal Ultrasounds or other Referrals:  None Maternal Substance Abuse:  No Significant Maternal Medications:  None Significant Maternal Lab Results: Lab values include: Group B Strep negative  Results for orders placed or performed during the hospital encounter of 05/16/18 (from the past 24 hour(s))  POCT fern test   Collection Time: 05/16/18  9:22 PM  Result Value Ref Range   POCT Fern Test    CBC   Collection Time: 05/16/18  9:40 PM  Result Value Ref Range   WBC 15.3 (H) 4.0 - 10.5 K/uL   RBC 3.78 (L) 3.87 - 5.11 MIL/uL   Hemoglobin 11.8 (L) 12.0 - 15.0 g/dL   HCT 29.536.1 62.136.0 - 30.846.0 %   MCV 95.5 78.0 - 100.0 fL   MCH 31.2 26.0 - 34.0 pg   MCHC 32.7 30.0 - 36.0 g/dL   RDW 65.713.4 84.611.5 - 96.215.5 %   Platelets 288 150 - 400 K/uL    Patient Active Problem List   Diagnosis Date Noted  . Normal labor 05/16/2018  . Anemia in pregnancy 03/09/2018  . Obesity affecting pregnancy, antepartum 11/20/2017  . Obesity (BMI 30.0-34.9) 11/20/2017  . Abnormal cervical Papanicolaou smear affecting pregnancy, antepartum 11/20/2017  . LGSIL on Pap smear of cervix 11/20/2017  . Supervision of normal  first pregnancy, antepartum 11/12/2017    Assessment: Joretta BachelorJamiya Albanese is a 22 y.o. G1P0 at 8153w5d here for SROM @2000 .   #Labor: Recheck cervix in 2 hours. Will augment if no  cervical change or no contractions  #Pain: Pain medication ordered PRN- patient plans no epidural  #FWB: Cat I #ID:  GBS neg  #MOF: Breast  #MOC:Declined contraception  #Circ:  N/A  Sharyon CableRogers, Huberta Tompkins C, CNM 05/16/2018, 11:04 PM

## 2018-05-17 ENCOUNTER — Encounter (HOSPITAL_COMMUNITY): Payer: Self-pay

## 2018-05-17 ENCOUNTER — Inpatient Hospital Stay (HOSPITAL_COMMUNITY): Payer: Medicaid Other | Admitting: Anesthesiology

## 2018-05-17 DIAGNOSIS — Z3A38 38 weeks gestation of pregnancy: Secondary | ICD-10-CM

## 2018-05-17 DIAGNOSIS — O4202 Full-term premature rupture of membranes, onset of labor within 24 hours of rupture: Secondary | ICD-10-CM

## 2018-05-17 DIAGNOSIS — O4290 Premature rupture of membranes, unspecified as to length of time between rupture and onset of labor, unspecified weeks of gestation: Secondary | ICD-10-CM | POA: Diagnosis present

## 2018-05-17 LAB — RPR: RPR Ser Ql: NONREACTIVE

## 2018-05-17 LAB — ABO/RH: ABO/RH(D): O POS

## 2018-05-17 MED ORDER — COCONUT OIL OIL: 1.0000 "application " | TOPICAL_OIL | Status: DC | PRN

## 2018-05-17 MED ORDER — FENTANYL 2.5 MCG/ML BUPIVACAINE 1/10 % EPIDURAL INFUSION (WH - ANES): 14.0000 mL/h | INTRAMUSCULAR | Status: DC | PRN

## 2018-05-17 MED ORDER — EPHEDRINE 5 MG/ML INJ
10.0000 mg | INTRAVENOUS | Status: DC | PRN
  Filled 2018-05-17: qty 2

## 2018-05-17 MED ORDER — OXYTOCIN 40 UNITS IN LACTATED RINGERS INFUSION - SIMPLE MED
1.0000 m[IU]/min | INTRAVENOUS | Status: DC
  Administered 2018-05-17: 2 m[IU]/min via INTRAVENOUS

## 2018-05-17 MED ORDER — SENNOSIDES-DOCUSATE SODIUM 8.6-50 MG PO TABS
2.0000 | ORAL_TABLET | ORAL | Status: DC
  Administered 2018-05-17: 2 via ORAL
  Filled 2018-05-17 (×2): qty 2

## 2018-05-17 MED ORDER — LACTATED RINGERS IV SOLN: 500.0000 mL | Freq: Once | INTRAVENOUS | Status: DC

## 2018-05-17 MED ORDER — PHENYLEPHRINE 40 MCG/ML (10ML) SYRINGE FOR IV PUSH (FOR BLOOD PRESSURE SUPPORT)
80.0000 ug | PREFILLED_SYRINGE | INTRAVENOUS | Status: DC | PRN
  Filled 2018-05-17: qty 5

## 2018-05-17 MED ORDER — IBUPROFEN 600 MG PO TABS
600.0000 mg | ORAL_TABLET | Freq: Four times a day (QID) | ORAL | Status: DC
  Administered 2018-05-17 – 2018-05-19 (×8): 600 mg via ORAL
  Filled 2018-05-17 (×8): qty 1

## 2018-05-17 MED ORDER — ONDANSETRON HCL 4 MG/2ML IJ SOLN: 4.0000 mg | INTRAMUSCULAR | Status: DC | PRN

## 2018-05-17 MED ORDER — TERBUTALINE SULFATE 1 MG/ML IJ SOLN
0.2500 mg | Freq: Once | INTRAMUSCULAR | Status: DC | PRN
  Filled 2018-05-17: qty 1

## 2018-05-17 MED ORDER — ZOLPIDEM TARTRATE 5 MG PO TABS: 5.0000 mg | ORAL_TABLET | Freq: Every evening | ORAL | Status: DC | PRN

## 2018-05-17 MED ORDER — FENTANYL 2.5 MCG/ML BUPIVACAINE 1/10 % EPIDURAL INFUSION (WH - ANES)
14.0000 mL/h | INTRAMUSCULAR | Status: DC | PRN
  Administered 2018-05-17: 14 mL/h via EPIDURAL
  Filled 2018-05-17: qty 100

## 2018-05-17 MED ORDER — SIMETHICONE 80 MG PO CHEW: 80.0000 mg | CHEWABLE_TABLET | ORAL | Status: DC | PRN

## 2018-05-17 MED ORDER — TETANUS-DIPHTH-ACELL PERTUSSIS 5-2.5-18.5 LF-MCG/0.5 IM SUSP: 0.5000 mL | Freq: Once | INTRAMUSCULAR | Status: DC

## 2018-05-17 MED ORDER — DIPHENHYDRAMINE HCL 50 MG/ML IJ SOLN: 12.5000 mg | INTRAMUSCULAR | Status: DC | PRN

## 2018-05-17 MED ORDER — LIDOCAINE HCL (PF) 1 % IJ SOLN
INTRAMUSCULAR | Status: DC | PRN
  Administered 2018-05-17 (×2): 4 mL via EPIDURAL

## 2018-05-17 MED ORDER — ONDANSETRON HCL 4 MG PO TABS: 4.0000 mg | ORAL_TABLET | ORAL | Status: DC | PRN

## 2018-05-17 MED ORDER — DIBUCAINE 1 % RE OINT: 1.0000 "application " | TOPICAL_OINTMENT | RECTAL | Status: DC | PRN

## 2018-05-17 MED ORDER — OXYCODONE-ACETAMINOPHEN 5-325 MG PO TABS: 1.0000 | ORAL_TABLET | ORAL | Status: DC | PRN

## 2018-05-17 MED ORDER — MISOPROSTOL 200 MCG PO TABS
800.0000 ug | ORAL_TABLET | Freq: Once | ORAL | Status: AC
  Administered 2018-05-17: 800 ug via ORAL
  Filled 2018-05-17: qty 4

## 2018-05-17 MED ORDER — DIPHENHYDRAMINE HCL 25 MG PO CAPS: 25.0000 mg | ORAL_CAPSULE | Freq: Four times a day (QID) | ORAL | Status: DC | PRN

## 2018-05-17 MED ORDER — PRENATAL MULTIVITAMIN CH
1.0000 | ORAL_TABLET | Freq: Every day | ORAL | Status: DC
  Administered 2018-05-17 – 2018-05-18 (×2): 1 via ORAL
  Filled 2018-05-17 (×2): qty 1

## 2018-05-17 MED ORDER — ACETAMINOPHEN 325 MG PO TABS: 650.0000 mg | ORAL_TABLET | ORAL | Status: DC | PRN

## 2018-05-17 MED ORDER — PHENYLEPHRINE 40 MCG/ML (10ML) SYRINGE FOR IV PUSH (FOR BLOOD PRESSURE SUPPORT)
80.0000 ug | PREFILLED_SYRINGE | INTRAVENOUS | Status: DC | PRN
  Filled 2018-05-17: qty 10
  Filled 2018-05-17: qty 5

## 2018-05-17 MED ORDER — BENZOCAINE-MENTHOL 20-0.5 % EX AERO
1.0000 "application " | INHALATION_SPRAY | CUTANEOUS | Status: DC | PRN
  Administered 2018-05-17: 1 via TOPICAL
  Filled 2018-05-17: qty 56

## 2018-05-17 MED ORDER — WITCH HAZEL-GLYCERIN EX PADS: 1.0000 "application " | MEDICATED_PAD | CUTANEOUS | Status: DC | PRN

## 2018-05-17 NOTE — Progress Notes (Signed)
Skin to skin 3 hrs.

## 2018-05-17 NOTE — Progress Notes (Signed)
LABOR PROGRESS NOTE  Debra BachelorJamiya Reilly is a 22 y.o. G1P0 at 6726w6d  admitted for PROM @2015   Subjective: Patient comfortable, feeling cramping but denies contractions hurting   Objective: BP (!) 137/96 (BP Location: Right Arm)   Pulse 82   Temp 98.1 F (36.7 Reilly) (Oral)   Resp 16   Ht 5\' 6"  (1.676 m)   Wt 250 lb (113.4 kg)   LMP 08/18/2017   BMI 40.35 kg/m  or  Vitals:   05/16/18 2053 05/16/18 2054 05/16/18 2234  BP:  119/75 (!) 137/96  Pulse:  90 82  Resp: 20  16  Temp: 98.2 F (36.8 Reilly)  98.1 F (36.7 Reilly)  TempSrc:   Oral  Weight: 250 lb (113.4 kg)    Height: 5\' 6"  (1.676 m)      Dilation: 2 Effacement (%): 80 Station: -2 Presentation: Vertex Exam by:: Debra Reilly, CNM FHT: baseline rate 140, moderate varibility, +acel, no decel Toco: 1.5-4.5  Labs: Lab Results  Component Value Date   WBC 15.3 (H) 05/16/2018   HGB 11.8 (L) 05/16/2018   HCT 36.1 05/16/2018   MCV 95.5 05/16/2018   PLT 288 05/16/2018    Patient Active Problem List   Diagnosis Date Noted  . PROM (premature rupture of membranes) 05/17/2018  . Anemia in pregnancy 03/09/2018  . Obesity affecting pregnancy, antepartum 11/20/2017  . Obesity (BMI 30.0-34.9) 11/20/2017  . Abnormal cervical Papanicolaou smear affecting pregnancy, antepartum 11/20/2017  . LGSIL on Pap smear of cervix 11/20/2017  . Supervision of normal first pregnancy, antepartum 11/12/2017    Assessment / Plan: 22 y.o. G1P0 at 1826w6d here for PROM @2015   Labor: Pitocin augmentation initiated due to no cervical change over 4 hours since rupture of membranes  Fetal Wellbeing:  Cat I Pain Control:  Medication ordered PRN Anticipated MOD:  SVD  Debra Reilly, Debra Reilly, CNM 05/17/2018, 12:29 AM

## 2018-05-17 NOTE — Anesthesia Postprocedure Evaluation (Signed)
Anesthesia Post Note  Patient: Joretta BachelorJamiya Focht  Procedure(s) Performed: AN AD HOC LABOR EPIDURAL     Patient location during evaluation: Mother Baby Anesthesia Type: Epidural Level of consciousness: awake and alert and oriented Pain management: satisfactory to patient Vital Signs Assessment: post-procedure vital signs reviewed and stable Respiratory status: respiratory function stable Cardiovascular status: stable Postop Assessment: no headache, no backache, epidural receding, patient able to bend at knees, no signs of nausea or vomiting and adequate PO intake Anesthetic complications: no    Last Vitals:  Vitals:   05/17/18 0950 05/17/18 1350  BP: (!) 107/58 117/63  Pulse: 82 81  Resp: 16 16  Temp: 37.8 C 36.8 C  SpO2: 100% 100%    Last Pain:  Vitals:   05/17/18 1350  TempSrc: Oral  PainSc: 0-No pain   Pain Goal:                 Cyndia Degraff

## 2018-05-17 NOTE — Anesthesia Procedure Notes (Signed)
Epidural Patient location during procedure: OB  Staffing Anesthesiologist: Nyelli Samara, MD Performed: anesthesiologist   Preanesthetic Checklist Completed: patient identified, pre-op evaluation, timeout performed, IV checked, risks and benefits discussed and monitors and equipment checked  Epidural Patient position: sitting Prep: site prepped and draped and DuraPrep Patient monitoring: heart rate, continuous pulse ox and blood pressure Approach: midline Location: L3-L4 Injection technique: LOR air and LOR saline  Needle:  Needle type: Tuohy  Needle gauge: 17 G Needle length: 9 cm Needle insertion depth: 9 cm Catheter type: closed end flexible Catheter size: 19 Gauge Catheter at skin depth: 14 cm Test dose: negative  Assessment Sensory level: T8 Events: blood not aspirated, injection not painful, no injection resistance, negative IV test and no paresthesia  Additional Notes Reason for block:procedure for pain     

## 2018-05-17 NOTE — Anesthesia Preprocedure Evaluation (Signed)
Anesthesia Evaluation  Patient identified by MRN, date of birth, ID band Patient awake    Reviewed: Allergy & Precautions, NPO status , Patient's Chart, lab work & pertinent test results  Airway Mallampati: II  TM Distance: >3 FB Neck ROM: Full    Dental  (+) Teeth Intact   Pulmonary neg pulmonary ROS,    Pulmonary exam normal breath sounds clear to auscultation       Cardiovascular negative cardio ROS Normal cardiovascular exam Rhythm:Regular Rate:Normal     Neuro/Psych negative neurological ROS  negative psych ROS   GI/Hepatic negative GI ROS, Neg liver ROS,   Endo/Other  Morbid obesity  Renal/GU negative Renal ROS     Musculoskeletal negative musculoskeletal ROS (+)   Abdominal (+) + obese,   Peds  Hematology  (+) Blood dyscrasia, anemia ,   Anesthesia Other Findings   Reproductive/Obstetrics (+) Pregnancy                             Anesthesia Physical Anesthesia Plan  ASA: III  Anesthesia Plan: Epidural   Post-op Pain Management:    Induction:   PONV Risk Score and Plan:   Airway Management Planned:   Additional Equipment:   Intra-op Plan:   Post-operative Plan:   Informed Consent: I have reviewed the patients History and Physical, chart, labs and discussed the procedure including the risks, benefits and alternatives for the proposed anesthesia with the patient or authorized representative who has indicated his/her understanding and acceptance.     Plan Discussed with:   Anesthesia Plan Comments:         Anesthesia Quick Evaluation

## 2018-05-17 NOTE — Lactation Note (Signed)
This note was copied from a baby's chart. Lactation Consultation Note  Patient Name: Debra Reilly ZOXWR'UToday's Date: 05/17/2018 Reason for consult: Initial assessment;1st time breastfeeding;Primapara;Early term 5637-38.6wks  11 hours old early term female who is being exclusively BF by her mother, she's a P1. [facility] [place] and she already knows how to hand express. She doesn't have a pump at home, offered a hand pump from the hospital but she declined and asked FOB who [facility], to buy her a DEBP. NT was giving baby a bath when entering the room, offered assistance with latch when baby was done since mom had to do STS anyway, but she declined stating her baby already fed.  Per mom, feedings at the breast are comfortable and both of her nipples looked intact upon examination with no signs of trauma. Mom has everted nipples but they're short shafted, however it doesn't seem to interfere with baby's ability to feed because mom is able to hear swallows when baby is at the breast. Asked mom to call for latch assistance when needed.  Encouraged mom to feed baby 8-12 times/24 hours or sooner if feeding cues are present. If baby is not cueing in a 3 hour period, she'll wake her up to feed. BF brochure, BF resources and feeding diary were reviewed, mom is aware of LC services and will call PRN.    Maternal Data Formula Feeding for Exclusion: No Has patient been taught Hand Expression?: Yes Does the patient have breastfeeding experience prior to this delivery?: No  Feeding Length of feed: 15 min  Interventions Interventions: Breast feeding basics reviewed  Lactation Tools Discussed/Used WIC Program: Yes   Consult Status Consult Status: Follow-up Date: 05/18/18 Follow-up type: In-patient    Avelina Mcclurkin Venetia ConstableS Zamar Odwyer 05/17/2018, 5:27 PM

## 2018-05-18 NOTE — Progress Notes (Signed)
Post Partum Day 1 Subjective: no complaints, up ad lib, voiding and tolerating PO  Objective: Blood pressure (!) 102/58, pulse 61, temperature 97.7 F (36.5 C), temperature source Axillary, resp. rate 18, height 5\' 6"  (1.676 m), weight 250 lb (113.4 kg), last menstrual period 08/18/2017, SpO2 100 %, unknown if currently breastfeeding.  Physical Exam:  General: alert, cooperative and no distress Lochia: appropriate Uterine Fundus: firm Incision: healing well, no significant drainage, no dehiscence, no significant erythema DVT Evaluation: No evidence of DVT seen on physical exam. Negative Homan's sign. No cords or calf tenderness. No significant calf/ankle edema.  Recent Labs    05/16/18 2140  HGB 11.8*  HCT 36.1    Assessment/Plan: Plan for discharge tomorrow, Breastfeeding and Contraception discussed options   LOS: 2 days   Debra Reilly, CNM 05/18/2018, 8:05 AM

## 2018-05-19 NOTE — Lactation Note (Signed)
This note was copied from a baby's chart. Lactation Consultation Note  Patient Name: Debra Reilly JWJXB'JToday's Date: 05/19/2018 Reason for consult: Follow-up assessment;Early term 37-38.6wks;1st time breastfeeding   Follow up with Mom of 54 hour old infant. Infant with 7 BF for 10-30 minutes and 3 stools in the last 24 hours. Infant has had 1 void and 4 stools in life. Parents are unsure if infant had any other voids, enc them to check diaper carefully with next diaper change.   Infant was latched to the left breast when LC entered room. Infant was latched deeply, although she was noted to have some cheek dimpling. Cheek dimpling was improved some with gentle chin tug. Mom reports infant does have dimples. Infant was sleepy at the breast. Enc mom to feed STS and to keep infant stimulated during feeding and to massage/compress breast with feeding. Infant more active with stimulation and breast compression with increased swallows. Enc mom to continue at home, mom voiced understanding. Discussed with mom that if infant not voiding better in the next 24 hours that mom should begin pumping and hand expressing and offer infant all EBM with spoon/or bottle. Mom voiced understanding. Mom reports she is feeling fuller today. She reports pain with initial latch that improves with feeding.   Reviewed I/O, signs of dehydration in the infant, how to know infant is getting enough, Engorgement prevention/treatment, and breast milk expression and storage.   Mom was given a manual pump with instructions for use and cleaning. Mom reports she is able to hand express and she reports milk is easier to express.   [facility] and has [facility] the [facility]. [facility] reviewed, mom informed of OP services, BF Support Groups and LC phone #. Mom reports all questions/concerns have been answered. Mom to call with any questions/concerns.    Maternal Data Formula Feeding for Exclusion: No Has patient been taught  Hand Expression?: Yes Does the patient have breastfeeding experience prior to this delivery?: No  Feeding Feeding Type: Breast Fed Length of feed: 15 min(infant still feeding when LC left )  LATCH Score Latch: Repeated attempts needed to sustain latch, nipple held in mouth throughout feeding, stimulation needed to elicit sucking reflex.  Audible Swallowing: Spontaneous and intermittent  Type of Nipple: Everted at rest and after stimulation  Comfort (Breast/Nipple): Soft / non-tender  Hold (Positioning): No assistance needed to correctly position infant at breast.  LATCH Score: 9  Interventions Interventions: Breast feeding basics reviewed;Support pillows;Position options;Skin to skin;Breast massage;Breast compression;Hand express  Lactation Tools Discussed/Used WIC Program: Yes Pump Review: Setup, frequency, and cleaning;Milk Storage   Consult Status Consult Status: Complete Follow-up type: Call as needed    Ed BlalockSharon S Hice 05/19/2018, 12:35 PM

## 2018-05-19 NOTE — Discharge Summary (Signed)
OB Discharge Summary     Patient Name: Debra Reilly DOB: 06/17/1996 MRN: [id number]  Date of admission: 05/16/2018 Delivering MD: Sharyon Cable   Date of discharge: 05/19/2018  Admitting diagnosis: 38 WKS, WATER BROKE Intrauterine pregnancy: [redacted]w[redacted]d     Secondary diagnosis:  Active Problems:   PROM (premature rupture of membranes)   SVD (spontaneous vaginal delivery)  Additional problems: none     Discharge diagnosis: Term Pregnancy Delivered                                                                                                Post partum procedures:none  Augmentation: Pitocin  Complications: None  Hospital course:  Onset of Labor With Vaginal Delivery     22 y.o. yo G1P1001 at [redacted]w[redacted]d was admitted in Latent Labor on 05/16/2018. Patient had an uncomplicated labor course as follows:  Membrane Rupture Time/Date: 8:15 PM ,05/16/2018   Intrapartum Procedures: Episiotomy: None [1]                                         Lacerations:  Labial [10]  Patient had Reilly delivery of Reilly Viable infant. 05/17/2018  Information for the patient's newborn:  Debra Reilly [[id number]]  Delivery Method: Vaginal, Spontaneous(Filed from Delivery Summary)    Pateint had an uncomplicated postpartum course.  She is ambulating, tolerating Reilly regular diet, passing flatus, and urinating well. Patient is discharged home in stable condition on 05/19/18.   Physical exam  Vitals:   05/18/18 0517 05/18/18 1625 05/18/18 2329 05/19/18 0536  BP: (!) 102/58 118/68 117/78 117/70  Pulse: 61 68 74 87  Resp: 18 18 18 16   Temp: 97.7 F (36.5 C) 97.6 F (36.4 C) 97.7 F (36.5 C) 98.5 F (36.9 C)  TempSrc: Axillary Oral Oral Oral  SpO2:    100%  Weight:      Height:       General: alert, cooperative and no distress Lochia: appropriate Uterine Fundus: firm Incision: N/Reilly DVT Evaluation: No evidence of DVT seen on physical exam. No cords or calf tenderness. No significant calf/ankle  edema. Labs: Lab Results  Component Value Date   WBC 15.3 (H) 05/16/2018   HGB 11.8 (L) 05/16/2018   HCT 36.1 05/16/2018   MCV 95.5 05/16/2018   PLT 288 05/16/2018   No flowsheet data found.  Discharge instruction: per After Visit Summary and "Baby and Me Booklet".  After visit meds:  Allergies as of 05/19/2018   No Known Allergies     Medication List    TAKE these medications   CITRANATAL BLOOM 90-1 MG Tabs Take 1 tablet by mouth daily.   ferrous sulfate 325 (65 FE) MG tablet Take 325 mg by mouth daily with breakfast.   PRENATE PIXIE 10-0.6-0.4-200 MG Caps Take 1 tablet by mouth daily.       Diet: routine diet  Activity: Advance as tolerated. Pelvic rest for 6 weeks.   Outpatient follow up:4 weeks  Follow up Appt: Future Appointments  Date Time Provider Department Center  06/17/2018  2:00 PM Debra Reilly, CNM [facility] None   Follow up Visit:No follow-ups on file.  Postpartum contraception: IUD Mirena  Newborn Data: Live born female  Birth Weight: 6 lb 9.1 oz (2980 g) APGAR: 8, 9  Newborn Delivery   Birth date/time:  05/17/2018 05:50:00 Delivery type:  Vaginal, Spontaneous     Baby Feeding: Breast Disposition:home with mother   05/19/2018 Sharyon CableVeronica C Camira Reilly, CNM

## 2018-05-21 ENCOUNTER — Encounter: Payer: Medicaid Other | Admitting: Certified Nurse Midwife

## 2018-06-17 ENCOUNTER — Encounter: Payer: Self-pay | Admitting: Obstetrics & Gynecology

## 2018-06-17 ENCOUNTER — Ambulatory Visit (INDEPENDENT_AMBULATORY_CARE_PROVIDER_SITE_OTHER): Payer: Medicaid Other | Admitting: Obstetrics & Gynecology

## 2018-06-17 ENCOUNTER — Ambulatory Visit: Payer: Medicaid Other | Admitting: Certified Nurse Midwife

## 2018-06-17 VITALS — BP 119/76 | HR 80 | Wt 247.9 lb

## 2018-06-17 DIAGNOSIS — Z30430 Encounter for insertion of intrauterine contraceptive device: Secondary | ICD-10-CM

## 2018-06-17 DIAGNOSIS — Z3202 Encounter for pregnancy test, result negative: Secondary | ICD-10-CM

## 2018-06-17 DIAGNOSIS — Z1389 Encounter for screening for other disorder: Secondary | ICD-10-CM

## 2018-06-17 DIAGNOSIS — R87612 Low grade squamous intraepithelial lesion on cytologic smear of cervix (LGSIL): Secondary | ICD-10-CM

## 2018-06-17 LAB — POCT URINE PREGNANCY: PREG TEST UR: NEGATIVE

## 2018-06-17 MED ORDER — LEVONORGESTREL 20 MCG/24HR IU IUD
INTRAUTERINE_SYSTEM | Freq: Once | INTRAUTERINE | Status: AC
  Administered 2018-06-17: 15:00:00 via INTRAUTERINE

## 2018-06-17 NOTE — Patient Instructions (Signed)

## 2018-06-17 NOTE — Progress Notes (Signed)
Post Partum Exam  Debra BachelorJamiya Reilly is a 22 y.o. 51P1001 female who presents for a postpartum visit. She is 4 weeks postpartum following a spontaneous vaginal delivery. I have fully reviewed the prenatal and intrapartum course. The delivery was at 38 gestational weeks.  Anesthesia: epidural. Postpartum course has been unremarkable. Baby's course has been unremarkable. Baby is feeding by both breast and bottle - Carnation Good Start. Bleeding no bleeding. Bowel function is normal. Bladder function is normal. Patient is not sexually active. Contraception method is IUD. Postpartum depression screening:neg  The following portions of the patient's history were reviewed and updated as appropriate: allergies, current medications, past family history, past medical history, past social history, past surgical history and problem list. Last pap smear done 11/12/17 and was Abnormal- LGSIL-CIN1  Review of Systems Pertinent items are noted in HPI.    Objective:  unknown if currently breastfeeding.  General:  alert, cooperative and no distress           Abdomen: no distension   Vulva:  normal  Vagina: normal vagina  Cervix:  no lesions  Corpus: not examined  Adnexa:  not evaluated  Rectal Exam: Not performed.       Patient identified, informed consent performed, signed copy in chart, time out was performed.  Urine pregnancy test negative.  Speculum placed in the vagina.  Cervix visualized.  Cleaned with Betadine x 2.  Grasped anteriourly with a single tooth tenaculum.  Uterus sounded to 9 cm.  Mirena IUD placed per manufacturer's recommendations.  Strings trimmed to 3 cm.   Patient given post procedure instructions and Mirena care card with expiration date.  Patient is asked to check IUD strings periodically and follow up in 4-6 weeks for IUD check. Assessment:    normal postpartum exam. Pap smear not done at today's visit.   Plan:   1. Contraception: IUD 2. Mirena placed today 3. Follow up in: 4  weeks or as needed. Needs colposcopy  Debra Reilly, James G, MD 06/17/2018

## 2018-07-15 ENCOUNTER — Other Ambulatory Visit (HOSPITAL_COMMUNITY)
Admission: RE | Admit: 2018-07-15 | Discharge: 2018-07-15 | Disposition: A | Discharge status: Home / Self Care | Payer: Medicaid Other | Source: Ambulatory Visit | Attending: Obstetrics & Gynecology | Admitting: Obstetrics & Gynecology

## 2018-07-15 ENCOUNTER — Encounter: Payer: Self-pay | Admitting: Obstetrics & Gynecology

## 2018-07-15 ENCOUNTER — Ambulatory Visit (INDEPENDENT_AMBULATORY_CARE_PROVIDER_SITE_OTHER): Payer: Medicaid Other | Admitting: Obstetrics & Gynecology

## 2018-07-15 VITALS — BP 111/76 | HR 89 | Ht 66.0 in | Wt 249.2 lb

## 2018-07-15 DIAGNOSIS — Z3202 Encounter for pregnancy test, result negative: Secondary | ICD-10-CM

## 2018-07-15 DIAGNOSIS — R87612 Low grade squamous intraepithelial lesion on cytologic smear of cervix (LGSIL): Secondary | ICD-10-CM | POA: Diagnosis not present

## 2018-07-15 DIAGNOSIS — Z30431 Encounter for routine checking of intrauterine contraceptive device: Secondary | ICD-10-CM

## 2018-07-15 DIAGNOSIS — N870 Mild cervical dysplasia: Secondary | ICD-10-CM | POA: Diagnosis not present

## 2018-07-15 LAB — POCT URINE PREGNANCY: Preg Test, Ur: NEGATIVE

## 2018-07-15 NOTE — Progress Notes (Signed)
Pt presents for colpo and IUD string check.

## 2018-07-15 NOTE — Progress Notes (Signed)
Patient ID: Debra Reilly, female   DOB: Oct 26, 1996, 22 y.o.   MRN:Debra Reilly [id number]  Chief Complaint  Patient presents with  . Gynecologic Exam    HPI Debra BachelorJamiya Reilly is a 22 y.o. female.  She comes for string check and colposcopy HPI  Indications: Pap smear on June 2019 showed: low-grade squamous intraepithelial neoplasia (LGSIL - encompassing HPV,mild dysplasia,CIN I). Previous colposcopy: none. Prior cervical treatment: no treatment.  Past Medical History:  Diagnosis Date  . Medical history non-contributory     History reviewed. No pertinent surgical history.  Family History  Problem Relation Age of Onset  . Diabetes Maternal Aunt   . Hypertension Paternal Uncle     Social History Social History   Tobacco Use  . Smoking status: Never Smoker  . Smokeless tobacco: Never Used  Substance Use Topics  . Alcohol use: No    Frequency: Never  . Drug use: Yes    Types: Marijuana    Comment: twice a month    No Known Allergies  Current Outpatient Medications  Medication Sig Dispense Refill  . FENUGREEK PO Take by mouth.    . ferrous sulfate 325 (65 FE) MG tablet Take 325 mg by mouth daily with breakfast.    . Prenat-FeAsp-Meth-FA-DHA w/o A (PRENATE PIXIE) 10-0.6-0.4-200 MG CAPS Take 1 tablet by mouth daily. 30 capsule 12  . Prenatal-DSS-FeCb-FeGl-FA (CITRANATAL BLOOM) 90-1 MG TABS Take 1 tablet by mouth daily. 30 tablet 12   No current facility-administered medications for this visit.     Review of Systems Review of Systems apresult s Blood pressure 111/76, pulse 89, height 5\' 6"  (1.676 m), weight 113 kg, last menstrual period 07/04/2018, currently breastfeeding.  Physical Exam Physical Exam  Data Reviewed Pap and office note  Assessment    Procedure Details  The risks and benefits of the procedure and Written informed consent obtained.  Speculum placed in vagina and excellent visualization of cervix achieved, cervix swabbed x 3 with acetic acid solution. TZ, SCJ seen  and no lesion at endocervix, minimal AWE and no abnormal vessels  String 5 cm device tip not seen Specimens: ECC and BX 2 and 800  Complications: none.     Plan    Specimens labelled and sent to Pathology. Call to discuss Pathology results in 2 weeks.      Debra DarterJames Nysa Reilly 07/15/2018, 2:31 PM

## 2018-07-15 NOTE — Addendum Note (Signed)
Addended by: Adam PhenixARNOLD, Clementine Soulliere G on: 07/15/2018 04:10 PM   Modules accepted: Orders

## 2018-11-16 IMAGING — US US MFM OB FOLLOW-UP
1 series · 14 of 26 positions shown · non-contrast
Comparison: none

[Series 1: us mfm ob follow-up · 26 acquisitions, 14 frames shown]
[im 1/26]
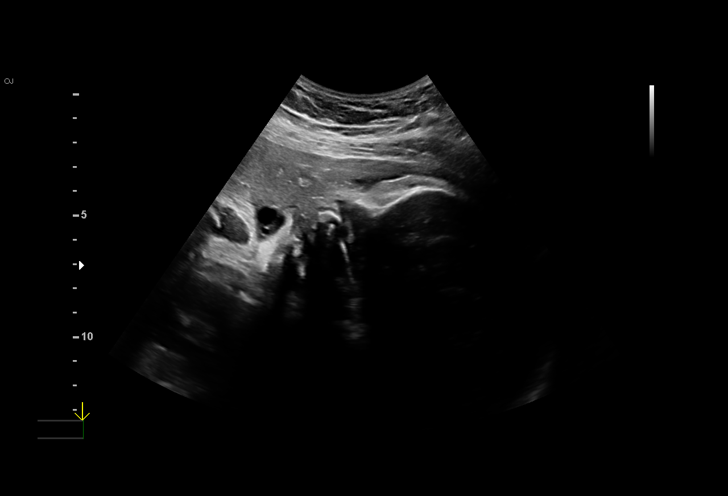
[im 3/26]
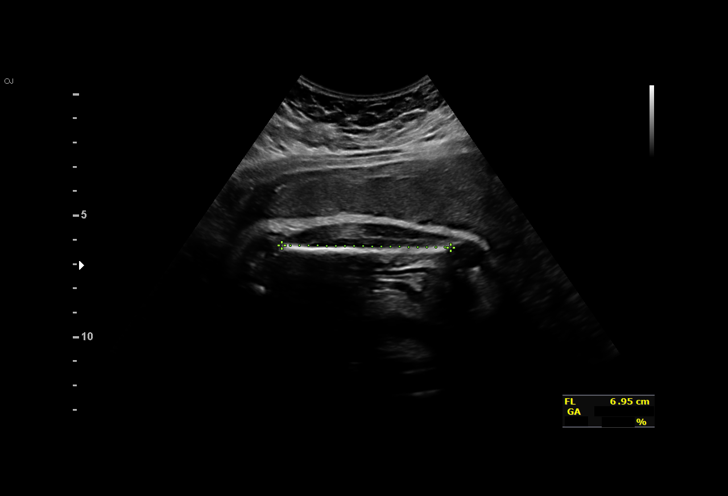
[im 5/26]
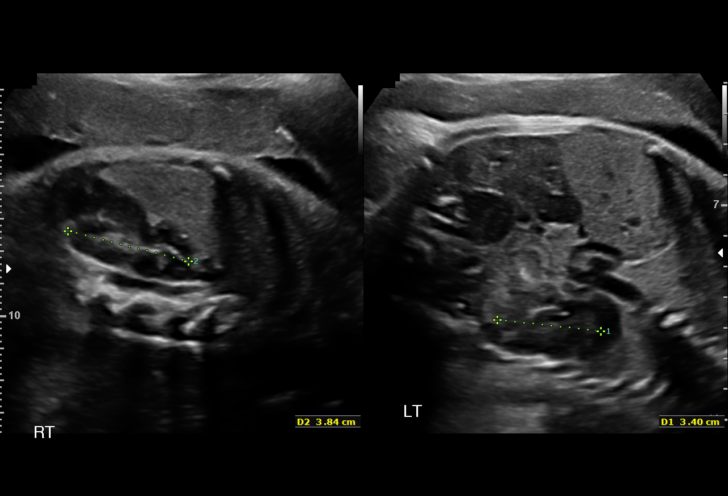
[im 7/26]
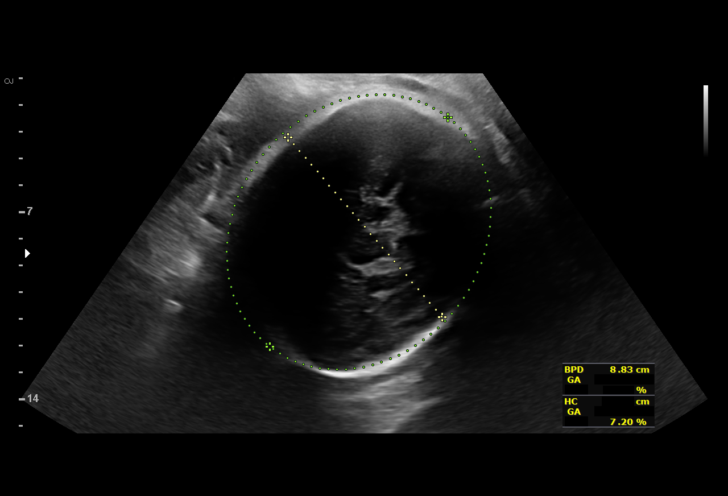
[im 9/26]
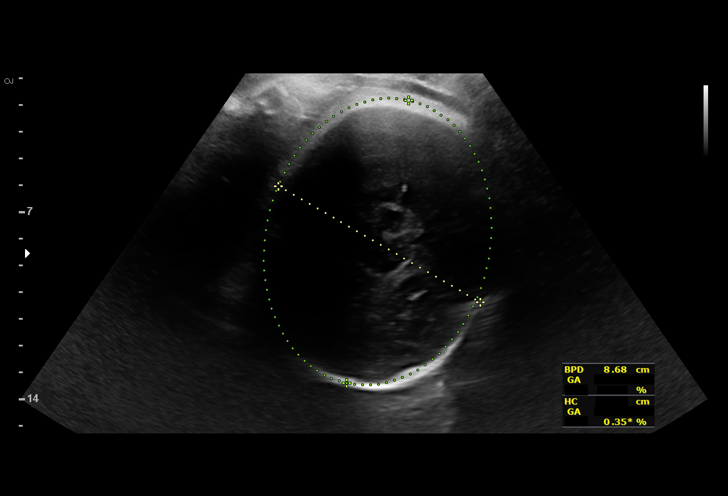
[im 11/26]
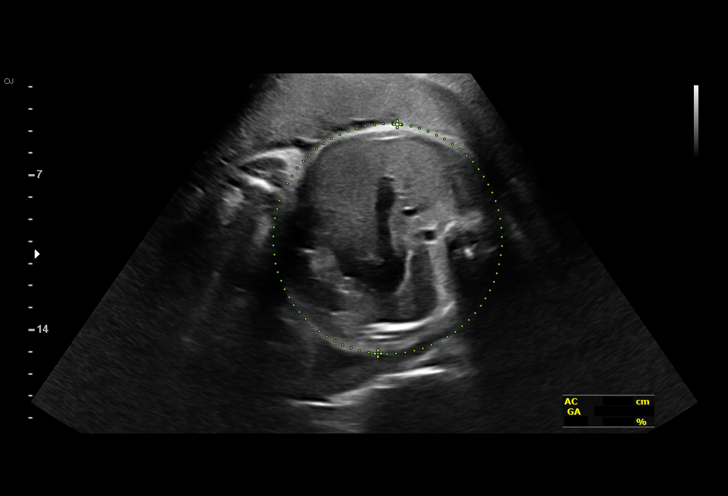
[im 13/26]
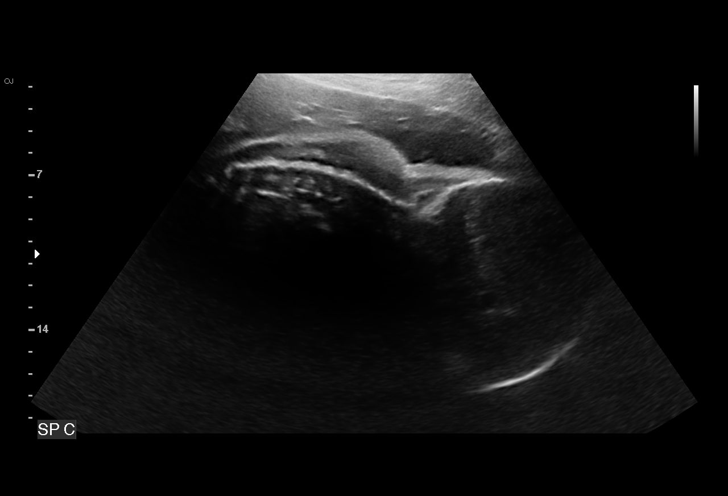
[im 14/26]
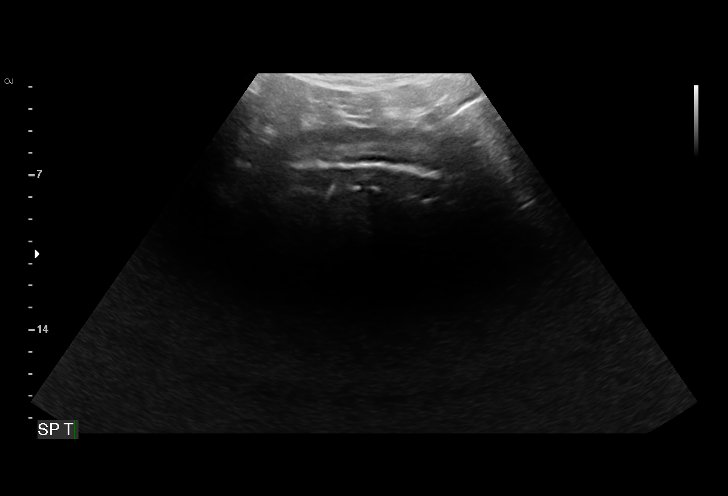
[im 16/26]
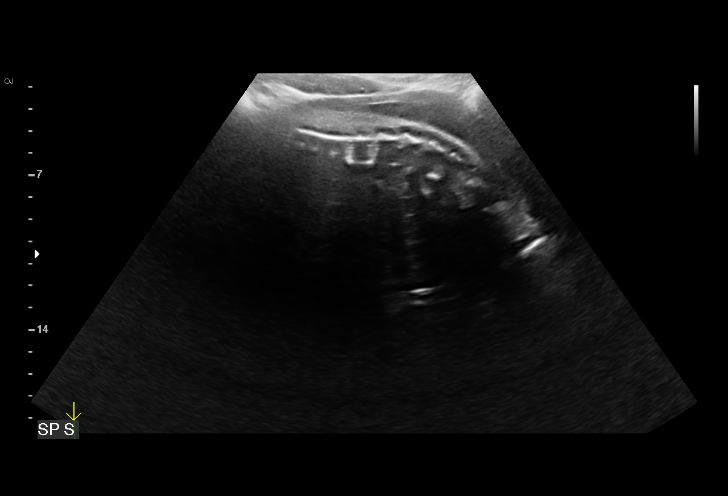
[im 18/26]
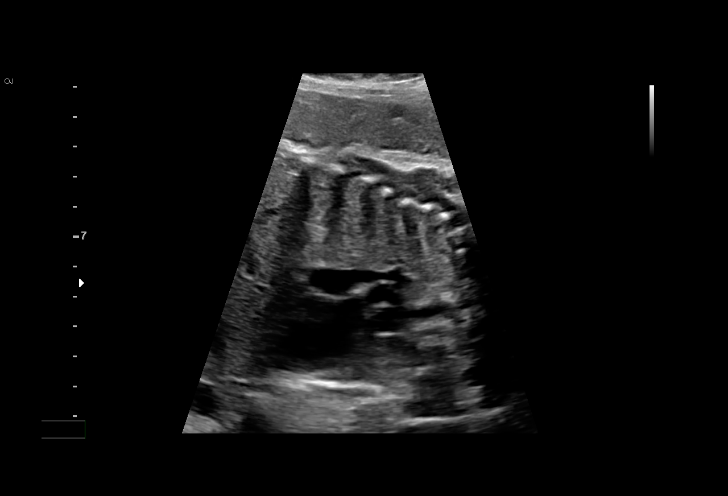
[im 20/26]
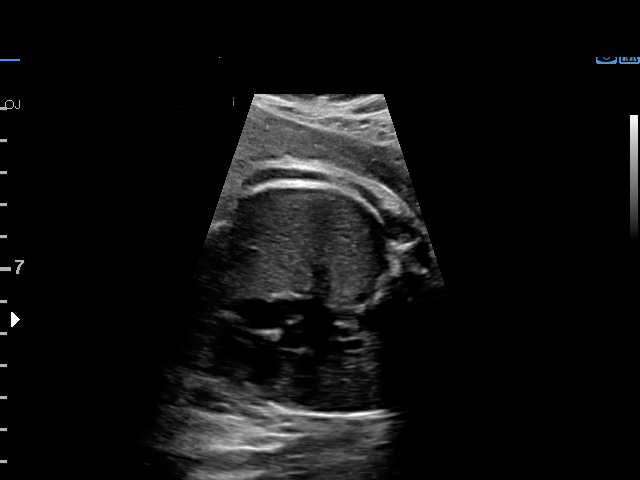
[im 22/26]
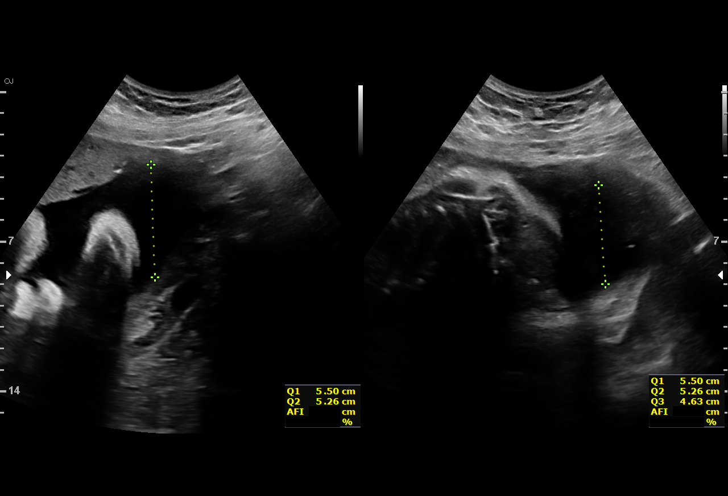
[im 24/26]
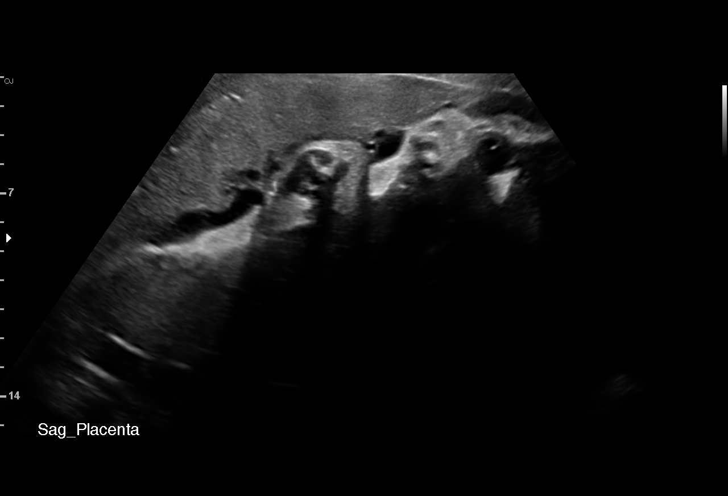
[im 26/26]
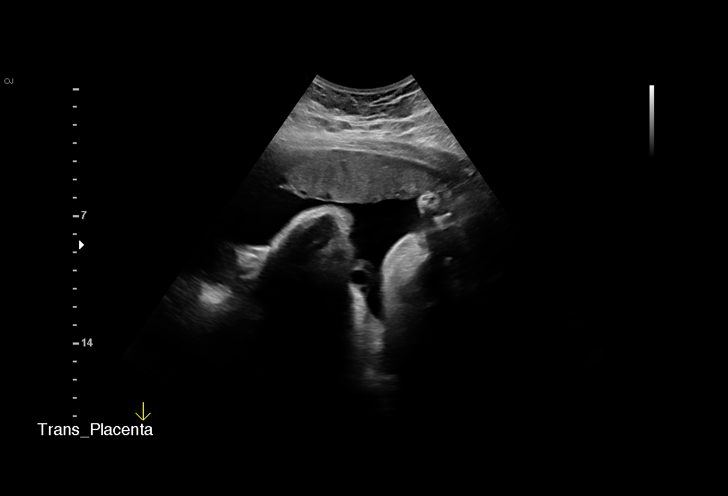

[14 of 26 positions shown; findings below may reference images not displayed]

Road [HOSPITAL]

Indications

36 weeks gestation of pregnancy
Obesity complicating pregnancy, second
trimester (pre pregnancy BMI 34.7)
Encounter for other antenatal screening
follow-up
OB History

Blood Type:            Height:  5'7"   Weight (lb):  225       BMI:
Gravidity:    1
Fetal Evaluation

Num Of Fetuses:     1
Fetal Heart         149
Rate(bpm):
Cardiac Activity:   Observed
Presentation:       Cephalic
Placenta:           Anterior, above cervical os
P. Cord Insertion:  Previously Visualized

Amniotic Fluid
AFI FV:      Subjectively within normal limits

AFI Sum(cm)     %Tile       Largest Pocket(cm)
15.39           57

RUQ(cm)                     LUQ(cm)        LLQ(cm)
5.5
Biometry
BPD:      87.9  mm     G. Age:  35w 4d         35  %    CI:         79.77  %    70 - 86
FL/HC:       22.4  %    20.1 -
HC:        311  mm     G. Age:  34w 6d        < 3  %    HC/AC:       0.98       0.93 -
AC:      318.4  mm     G. Age:  35w 5d         43  %    FL/BPD:      79.2  %    71 - 87
FL:       69.6  mm     G. Age:  35w 5d         29  %    FL/AC:       21.9  %    20 - 24

Est. FW:    5655   gm          6 lb     48  %
Gestational Age

LMP:           36w 3d        Date:  08/18/17                 EDD:    05/25/18
U/S Today:     35w 3d                                        EDD:    06/01/18
Best:          36w 3d     Det. By:  LMP  (08/18/17)          EDD:    05/25/18
Anatomy

Cranium:               Appears normal         Aortic Arch:            Previously seen
Cavum:                 Appears normal         Ductal Arch:            Previously seen
Ventricles:            Appears normal         Diaphragm:              Previously seen
Choroid Plexus:        Previously seen        Stomach:                Appears normal, left
sided
Cerebellum:            Previously seen        Abdomen:                Appears normal
Posterior Fossa:       Previously seen        Abdominal Wall:         Previously seen
Nuchal Fold:           Previously seen        Cord Vessels:           Appears normal (3
vessel cord)
Face:                  Orbits and profile     Kidneys:                Appear normal
previously seen
Lips:                  Previously seen        Bladder:                Appears normal
Thoracic:              Appears normal         Spine:                  Limited views prev
seen
Heart:                 Previously seen        Upper Extremities:      Previously seen
RVOT:                  Appears normal         Lower Extremities:      Previously seen
LVOT:                  Previously seen

Other:  Nasal bone visualized. Open hands prev.  visualized. Technically
difficult due to maternal habitus and fetal position.
Cervix Uterus Adnexa

Cervix
Not visualized (advanced GA >65wks)

Uterus
No abnormality visualized.

Left Ovary
Not visualized. No adnexal mass visualized.

Right Ovary
Not visualized. No adnexal mass visualized.

Cul De Sac:   No free fluid seen.

Adnexa:       No abnormality visualized.
Impression

SIUP at 36+3 weeks
Cephalic presentation
Normal interval anatomy; anatomic survey complete
Normal amniotic fluid volume
Appropriate interval growth with EFW at the 48th %tile
Recommendations

Follow-up as clinically indicated

## 2019-01-13 ENCOUNTER — Ambulatory Visit: Payer: Medicaid Other | Admitting: Nurse Practitioner

## 2020-08-08 DIAGNOSIS — Z20822 Contact with and (suspected) exposure to covid-19: Secondary | ICD-10-CM | POA: Diagnosis not present

## 2020-08-16 DIAGNOSIS — Z20822 Contact with and (suspected) exposure to covid-19: Secondary | ICD-10-CM | POA: Diagnosis not present

## 2020-11-21 DIAGNOSIS — B9689 Other specified bacterial agents as the cause of diseases classified elsewhere: Secondary | ICD-10-CM | POA: Diagnosis not present

## 2020-11-21 DIAGNOSIS — N760 Acute vaginitis: Secondary | ICD-10-CM | POA: Diagnosis not present

## 2020-11-21 DIAGNOSIS — N898 Other specified noninflammatory disorders of vagina: Secondary | ICD-10-CM | POA: Diagnosis not present

## 2020-12-04 DIAGNOSIS — B9689 Other specified bacterial agents as the cause of diseases classified elsewhere: Secondary | ICD-10-CM | POA: Diagnosis not present

## 2020-12-04 DIAGNOSIS — N760 Acute vaginitis: Secondary | ICD-10-CM | POA: Diagnosis not present

## 2020-12-14 DIAGNOSIS — N926 Irregular menstruation, unspecified: Secondary | ICD-10-CM | POA: Diagnosis not present

## 2021-02-09 DIAGNOSIS — R1084 Generalized abdominal pain: Secondary | ICD-10-CM | POA: Diagnosis not present

## 2021-02-09 DIAGNOSIS — N898 Other specified noninflammatory disorders of vagina: Secondary | ICD-10-CM | POA: Diagnosis not present

## 2021-12-16 ENCOUNTER — Emergency Department (HOSPITAL_BASED_OUTPATIENT_CLINIC_OR_DEPARTMENT_OTHER): Payer: Medicaid Other

## 2021-12-16 ENCOUNTER — Other Ambulatory Visit: Payer: Self-pay

## 2021-12-16 ENCOUNTER — Emergency Department (HOSPITAL_BASED_OUTPATIENT_CLINIC_OR_DEPARTMENT_OTHER)
Admission: EM | Admit: 2021-12-16 | Discharge: 2021-12-16 | Disposition: A | Discharge status: Home / Self Care | Payer: Medicaid Other | Attending: Emergency Medicine | Admitting: Emergency Medicine

## 2021-12-16 ENCOUNTER — Encounter (HOSPITAL_BASED_OUTPATIENT_CLINIC_OR_DEPARTMENT_OTHER): Payer: Self-pay | Admitting: Emergency Medicine

## 2021-12-16 DIAGNOSIS — S0990XA Unspecified injury of head, initial encounter: Secondary | ICD-10-CM | POA: Diagnosis present

## 2021-12-16 DIAGNOSIS — S060X0A Concussion without loss of consciousness, initial encounter: Secondary | ICD-10-CM | POA: Diagnosis not present

## 2021-12-16 DIAGNOSIS — R519 Headache, unspecified: Secondary | ICD-10-CM

## 2021-12-16 MED ORDER — METOCLOPRAMIDE HCL 5 MG/ML IJ SOLN
10.0000 mg | Freq: Once | INTRAMUSCULAR | Status: AC
  Administered 2021-12-16: 10 mg via INTRAVENOUS
  Filled 2021-12-16: qty 2

## 2021-12-16 MED ORDER — ONDANSETRON 4 MG PO TBDP: 4.0000 mg | ORAL_TABLET | Freq: Three times a day (TID) | ORAL | 0 refills | Status: DC | PRN

## 2021-12-16 MED ORDER — KETOROLAC TROMETHAMINE 15 MG/ML IJ SOLN
15.0000 mg | Freq: Once | INTRAMUSCULAR | Status: AC
  Administered 2021-12-16: 15 mg via INTRAVENOUS
  Filled 2021-12-16: qty 1

## 2021-12-16 MED ORDER — DIPHENHYDRAMINE HCL 50 MG/ML IJ SOLN
50.0000 mg | Freq: Once | INTRAMUSCULAR | Status: AC
Start: 2021-12-16 — End: 2021-12-16
  Administered 2021-12-16: 50 mg via INTRAVENOUS
  Filled 2021-12-16: qty 1

## 2021-12-16 MED ORDER — SODIUM CHLORIDE 0.9 % IV BOLUS
1000.0000 mL | Freq: Once | INTRAVENOUS | Status: AC
  Administered 2021-12-16: 1000 mL via INTRAVENOUS
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
## 2022-06-13 DIAGNOSIS — Z113 Encounter for screening for infections with a predominantly sexual mode of transmission: Secondary | ICD-10-CM | POA: Diagnosis not present

## 2022-06-13 DIAGNOSIS — Z118 Encounter for screening for other infectious and parasitic diseases: Secondary | ICD-10-CM | POA: Diagnosis not present

## 2022-06-13 DIAGNOSIS — R350 Frequency of micturition: Secondary | ICD-10-CM | POA: Diagnosis not present

## 2022-06-13 DIAGNOSIS — N760 Acute vaginitis: Secondary | ICD-10-CM | POA: Diagnosis not present

## 2022-06-13 DIAGNOSIS — N898 Other specified noninflammatory disorders of vagina: Secondary | ICD-10-CM | POA: Diagnosis not present

## 2022-06-13 DIAGNOSIS — B9689 Other specified bacterial agents as the cause of diseases classified elsewhere: Secondary | ICD-10-CM | POA: Diagnosis not present

## 2022-07-22 DIAGNOSIS — S99922A Unspecified injury of left foot, initial encounter: Secondary | ICD-10-CM | POA: Diagnosis not present

## 2022-07-22 DIAGNOSIS — M79672 Pain in left foot: Secondary | ICD-10-CM | POA: Diagnosis not present

## 2022-08-05 DIAGNOSIS — N898 Other specified noninflammatory disorders of vagina: Secondary | ICD-10-CM | POA: Diagnosis not present

## 2022-10-04 ENCOUNTER — Emergency Department (HOSPITAL_BASED_OUTPATIENT_CLINIC_OR_DEPARTMENT_OTHER)
Admission: EM | Admit: 2022-10-04 | Discharge: 2022-10-04 | Disposition: A | Discharge status: Home / Self Care | Payer: Medicaid Other | Attending: Emergency Medicine | Admitting: Emergency Medicine

## 2022-10-04 ENCOUNTER — Other Ambulatory Visit: Payer: Self-pay

## 2022-10-04 ENCOUNTER — Encounter (HOSPITAL_BASED_OUTPATIENT_CLINIC_OR_DEPARTMENT_OTHER): Payer: Self-pay | Admitting: Emergency Medicine

## 2022-10-04 DIAGNOSIS — X58XXXA Exposure to other specified factors, initial encounter: Secondary | ICD-10-CM | POA: Diagnosis not present

## 2022-10-04 DIAGNOSIS — T192XXA Foreign body in vulva and vagina, initial encounter: Secondary | ICD-10-CM | POA: Diagnosis not present

## 2022-10-04 DIAGNOSIS — Z789 Other specified health status: Secondary | ICD-10-CM

## 2022-10-04 NOTE — ED Notes (Signed)
Pelvic cart at bedside. 

## 2022-10-04 NOTE — ED Provider Notes (Incomplete)
  [facility] EMERGENCY DEPARTMENT Provider Note   CSN: [id number] Arrival date & time: 10/04/22  1723     History {Add pertinent medical, surgical, social history, OB history to HPI:1} Chief Complaint  Patient presents with   Foreign Body in Vagina    Debra Reilly is a 26 y.o. female.  HPI     Home Medications Prior to Admission medications   Medication Sig Start Date End Date Taking? Authorizing Provider  FENUGREEK PO Take by mouth.    [provider]  ferrous sulfate 325 (65 FE) MG tablet Take 325 mg by mouth daily with breakfast.    [provider]  ondansetron (ZOFRAN-ODT) 4 MG disintegrating tablet Take 1 tablet (4 mg total) by mouth every 8 (eight) hours as needed for nausea or vomiting. 12/16/21   Hyman Hopes, Margaux, PA-C  Prenat-FeAsp-Meth-FA-DHA w/o A (PRENATE PIXIE) 10-0.6-0.4-200 MG CAPS Take 1 tablet by mouth daily. 11/12/17   Orvilla Cornwall A, CNM  Prenatal-DSS-FeCb-FeGl-FA (CITRANATAL BLOOM) 90-1 MG TABS Take 1 tablet by mouth daily. 03/09/18   Roe Coombs, CNM      Allergies    Patient has no known allergies.    Review of Systems   Review of Systems  Physical Exam Updated Vital Signs BP (!) 113/91   Pulse 73   Temp 98.5 F (36.9 C) (Oral)   Resp 20   Ht 5' 7.5" (1.715 m)   Wt 97.5 kg   SpO2 100%   BMI 33.18 kg/m  Physical Exam  ED Results / Procedures / Treatments   Labs (all labs ordered are listed, but only abnormal results are displayed) Labs Reviewed - No data to display  EKG None  Radiology No results found.  Procedures Procedures  {Document cardiac monitor, telemetry assessment procedure when appropriate:1}  Medications Ordered in ED Medications - No data to display  ED Course/ Medical Decision Making/ A&P                           Medical Decision Making  ***  {Document critical care time when appropriate:1} {Document review of labs and clinical decision tools ie heart score, Chads2Vasc2  etc:1}  {Document your independent review of radiology images, and any outside records:1} {Document your discussion with family members, caretakers, and with consultants:1} {Document social determinants of health affecting pt's care:1} {Document your decision making why or why not admission, treatments were needed:1} Final Clinical Impression(s) / ED Diagnoses Final diagnoses:  None    Rx / DC Orders ED Discharge Orders     None

## 2022-10-04 NOTE — ED Notes (Signed)
Pt resting in bed with visitor
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
## 2022-10-04 NOTE — Discharge Instructions (Addendum)
You were seen in the emergency department for a retained condom.  The condom was removed.  At home please continue to engage in safe sex and use condoms.   Return immediately to the emergency department if you experience any of the following: Significant vaginal bleeding, dizziness, fevers, or any other concerning symptoms

## 2023-01-09 DIAGNOSIS — Z113 Encounter for screening for infections with a predominantly sexual mode of transmission: Secondary | ICD-10-CM | POA: Diagnosis not present

## 2023-01-09 DIAGNOSIS — R202 Paresthesia of skin: Secondary | ICD-10-CM | POA: Diagnosis not present

## 2023-04-01 ENCOUNTER — Telehealth: Payer: Self-pay

## 2023-04-01 NOTE — Telephone Encounter (Signed)
Spoke with patient who declined to schedule stating she has PCP. AS< CMA

## 2023-04-14 DIAGNOSIS — Z113 Encounter for screening for infections with a predominantly sexual mode of transmission: Secondary | ICD-10-CM | POA: Diagnosis not present

## 2023-04-14 DIAGNOSIS — N898 Other specified noninflammatory disorders of vagina: Secondary | ICD-10-CM | POA: Diagnosis not present

## 2023-04-21 ENCOUNTER — Encounter (HOSPITAL_BASED_OUTPATIENT_CLINIC_OR_DEPARTMENT_OTHER): Payer: Self-pay

## 2023-04-21 ENCOUNTER — Emergency Department (HOSPITAL_BASED_OUTPATIENT_CLINIC_OR_DEPARTMENT_OTHER)
Admission: EM | Admit: 2023-04-21 | Discharge: 2023-04-21 | Disposition: A | Discharge status: Home / Self Care | Payer: Medicaid Other | Attending: Emergency Medicine | Admitting: Emergency Medicine

## 2023-04-21 ENCOUNTER — Other Ambulatory Visit: Payer: Self-pay

## 2023-04-21 DIAGNOSIS — R000 Tachycardia, unspecified: Secondary | ICD-10-CM | POA: Insufficient documentation

## 2023-04-21 DIAGNOSIS — R112 Nausea with vomiting, unspecified: Secondary | ICD-10-CM | POA: Insufficient documentation

## 2023-04-21 DIAGNOSIS — R197 Diarrhea, unspecified: Secondary | ICD-10-CM | POA: Insufficient documentation

## 2023-04-21 DIAGNOSIS — R1010 Upper abdominal pain, unspecified: Secondary | ICD-10-CM | POA: Insufficient documentation

## 2023-04-21 DIAGNOSIS — R0602 Shortness of breath: Secondary | ICD-10-CM | POA: Insufficient documentation

## 2023-04-21 LAB — COMPREHENSIVE METABOLIC PANEL
ALT: 7 U/L (ref 0–44)
AST: 13 U/L — ABNORMAL LOW (ref 15–41)
Albumin: 4.7 g/dL (ref 3.5–5.0)
Alkaline Phosphatase: 42 U/L (ref 38–126)
Anion gap: 11 (ref 5–15)
BUN: 11 mg/dL (ref 6–20)
CO2: 24 mmol/L (ref 22–32)
Calcium: 9.7 mg/dL (ref 8.9–10.3)
Chloride: 104 mmol/L (ref 98–111)
Creatinine, Ser: 0.75 mg/dL (ref 0.44–1.00)
GFR, Estimated: >60 mL/min (ref >60)
Glucose, Bld: 94 mg/dL (ref 70–99)
Potassium: 3.8 mmol/L (ref 3.5–5.1)
Sodium: 139 mmol/L (ref 135–145)
Total Bilirubin: 0.8 mg/dL (ref 0.3–1.2)
Total Protein: 8.2 g/dL — ABNORMAL HIGH (ref 6.5–8.1)

## 2023-04-21 LAB — PREGNANCY, URINE: Preg Test, Ur: NEGATIVE

## 2023-04-21 LAB — CBC
HCT: 41.3 % (ref 36.0–46.0)
Hemoglobin: 13.8 g/dL (ref 12.0–15.0)
MCH: 31.7 pg (ref 26.0–34.0)
MCHC: 33.4 g/dL (ref 30.0–36.0)
MCV: 94.9 fL (ref 80.0–100.0)
Platelets: 269 10*3/uL (ref 150–400)
RBC: 4.35 MIL/uL (ref 3.87–5.11)
RDW: 12.4 % (ref 11.5–15.5)
WBC: 9.2 10*3/uL (ref 4.0–10.5)
nRBC: 0 % (ref 0.0–0.2)

## 2023-04-21 LAB — URINALYSIS, ROUTINE W REFLEX MICROSCOPIC
Bilirubin Urine: NEGATIVE
Glucose, UA: NEGATIVE mg/dL
Hgb urine dipstick: NEGATIVE
Ketones, ur: 15 mg/dL — AB
Leukocytes,Ua: NEGATIVE
Nitrite: NEGATIVE
Specific Gravity, Urine: 1.023 (ref 1.005–1.030)
pH: 6 (ref 5.0–8.0)

## 2023-04-21 LAB — LIPASE, BLOOD: Lipase: <10 U/L — ABNORMAL LOW (ref 11–51)

## 2023-04-21 MED ORDER — ONDANSETRON HCL 4 MG/2ML IJ SOLN
4.0000 mg | Freq: Once | INTRAMUSCULAR | Status: AC
  Administered 2023-04-21: 4 mg via INTRAVENOUS
  Filled 2023-04-21: qty 2

## 2023-04-21 MED ORDER — SODIUM CHLORIDE 0.9 % IV BOLUS
1000.0000 mL | Freq: Once | INTRAVENOUS | Status: AC
  Administered 2023-04-21: 1000 mL via INTRAVENOUS

## 2023-04-21 MED ORDER — ONDANSETRON 4 MG PO TBDP: 4.0000 mg | ORAL_TABLET | Freq: Three times a day (TID) | ORAL | 0 refills | Status: DC | PRN

## 2023-04-21 NOTE — ED Triage Notes (Signed)
Patient arrives with complaints of new onset of vomiting and shortness of breath that started this morning. Patient reports no pain or sick exposures.

## 2023-04-21 NOTE — Discharge Instructions (Signed)
Please read and follow all provided instructions.  Your diagnoses today include:  1. Nausea vomiting and diarrhea     TTests performed today include: Blood cell counts and platelets Kidney and liver function tests Pancreas function test (called lipase) Urine test to look for infection A blood or urine test for pregnancy (women only) Vital signs. See below for your results today.   Medications prescribed:  Zofran (ondansetron) - for nausea and vomiting  Take any prescribed medications only as directed.  Home care instructions:  Follow any educational materials contained in this packet.  Keep drinking plenty of fluids and use the medicine for nausea as directed.   Drink clear liquids for the next 24 hours and introduce solid foods slowly after 24 hours using the b.r.a.t. diet (Bananas, Rice, Applesauce, Toast, Yogurt).    Follow-up instructions: Please follow-up with your primary care provider in the next 2 days for further evaluation of your symptoms. If you are not feeling better in 48 hours you may have a condition that is more serious and you need re-evaluation.   Return instructions:  SEEK IMMEDIATE MEDICAL ATTENTION IF: If you have pain that does not go away or becomes severe  A temperature above 101F develops  Repeated vomiting occurs (multiple episodes)  If you have pain that becomes localized to portions of the abdomen. The right side could possibly be appendicitis. In an adult, the left lower portion of the abdomen could be colitis or diverticulitis.  Blood is being passed in stools or vomit (bright red or black tarry stools)  You develop chest pain, difficulty breathing, dizziness or fainting, or become confused, poorly responsive, or inconsolable (young children) If you have any other emergent concerns regarding your health  Additional Information: Abdominal (belly) pain can be caused by many things. Your caregiver performed an examination and possibly ordered  blood/urine tests and imaging (CT scan, x-rays, ultrasound). Many cases can be observed and treated at home after initial evaluation in the emergency department. Even though you are being discharged home, abdominal pain can be unpredictable. Therefore, you need a repeated exam if your pain does not resolve, returns, or worsens. Most patients with abdominal pain don't have to be admitted to the hospital or have surgery, but serious problems like appendicitis and gallbladder attacks can start out as nonspecific pain. Many abdominal conditions cannot be diagnosed in one visit, so follow-up evaluations are very important.  Your vital signs today were: BP 130/77   Pulse 76   Temp 97.7 F (36.5 C) (Oral)   Resp 16   Ht 5\' 7"  (1.702 m)   Wt 97.5 kg   SpO2 100%   BMI 33.67 kg/m  If your blood pressure (bp) was elevated above 135/85 this visit, please have this repeated by your doctor within one month. --------------

## 2023-04-21 NOTE — ED Notes (Signed)
Given apple juice for PO challenge 

## 2023-04-21 NOTE — ED Provider Notes (Signed)
[place] EMERGENCY DEPARTMENT AT [facility] Provider Note   CSN: [id number] Arrival date & time: 04/21/23  4696     History  Chief Complaint  Patient presents with   Emesis   Shortness of Breath    Debra Reilly is a 27 y.o. female.  Patient presents the emergency department today for evaluation of nausea, vomiting, and diarrhea.  She states that her symptoms started about a week ago with diarrhea only.  This has been watery stool, nonbloody.  She denies recent suspicious food or water exposures, travel, antibiotic use.  Earlier this morning she developed a sharp pain in her upper abdomen while she was at work.  She then had episodes of nausea and vomiting as well as diarrhea.  She states that during the episodes of nausea and vomiting she was having difficulty with her breathing, however this was not present when she was not vomiting.  Abdominal pain resolved after vomiting.  She denies associated fevers, chest pain.  No lower extremity swelling.  No urinary symptoms.  She does not have history of abdominal surgeries.  She does recently report starting a new medication, Valtrex, which she took once 2 days ago, and once yesterday.  She did not have nausea or vomiting after taking this.  She did take it again this morning.  Family member voices concern that she is having a side effect of this medication.       Home Medications Prior to Admission medications   Medication Sig Start Date End Date Taking? Authorizing Provider  FENUGREEK PO Take by mouth.    [provider]  ferrous sulfate 325 (65 FE) MG tablet Take 325 mg by mouth daily with breakfast.    [provider]  ondansetron (ZOFRAN-ODT) 4 MG disintegrating tablet Take 1 tablet (4 mg total) by mouth every 8 (eight) hours as needed for nausea or vomiting. 12/16/21   Hyman Hopes, Margaux, PA-C  Prenat-FeAsp-Meth-FA-DHA w/o A (PRENATE PIXIE) 10-0.6-0.4-200 MG CAPS Take 1 tablet by mouth daily. 11/12/17   Orvilla Cornwall A, CNM  Prenatal-DSS-FeCb-FeGl-FA (CITRANATAL BLOOM) 90-1 MG TABS Take 1 tablet by mouth daily. 03/09/18   Roe Coombs, CNM      Allergies    Patient has no known allergies.    Review of Systems   Review of Systems  Physical Exam Updated Vital Signs BP (!) 140/103   Pulse (!) 132   Temp 97.7 F (36.5 C) (Oral)   Resp 16   Ht 5\' 7"  (1.702 m)   Wt 97.5 kg   SpO2 100%   BMI 33.67 kg/m   Physical Exam Vitals and nursing note reviewed.  Constitutional:      General: She is not in acute distress.    Appearance: She is well-developed.  HENT:     Head: Normocephalic and atraumatic.     Right Ear: External ear normal.     Left Ear: External ear normal.     Nose: Nose normal.     Mouth/Throat:     Mouth: Mucous membranes are moist.  Eyes:     Conjunctiva/sclera: Conjunctivae normal.  Cardiovascular:     Rate and Rhythm: Regular rhythm. Tachycardia present.     Heart sounds: No murmur heard. Pulmonary:     Effort: Pulmonary effort is normal. No respiratory distress.     Breath sounds: Normal breath sounds. No wheezing, rhonchi or rales.  Abdominal:     Palpations: Abdomen is soft.     Tenderness: There is no abdominal  tenderness. There is no guarding or rebound.  Musculoskeletal:     Cervical back: Normal range of motion and neck supple.     Right lower leg: No edema.     Left lower leg: No edema.  Skin:    General: Skin is warm and dry.     Findings: No rash.  Neurological:     General: No focal deficit present.     Mental Status: She is alert. Mental status is at baseline.     Motor: No weakness.  Psychiatric:        Mood and Affect: Mood normal.     ED Results / Procedures / Treatments   Labs (all labs ordered are listed, but only abnormal results are displayed) Labs Reviewed  LIPASE, BLOOD - Abnormal; Notable for the following components:      Result Value   Lipase <10 (*)    All other components within normal limits  COMPREHENSIVE  METABOLIC PANEL - Abnormal; Notable for the following components:   Total Protein 8.2 (*)    AST 13 (*)    All other components within normal limits  URINALYSIS, ROUTINE W REFLEX MICROSCOPIC - Abnormal; Notable for the following components:   Ketones, ur 15 (*)    Protein, ur TRACE (*)    All other components within normal limits  CBC  PREGNANCY, URINE    EKG None  Radiology No results found.  Procedures Procedures    Medications Ordered in ED Medications  sodium chloride 0.9 % bolus 1,000 mL (1,000 mLs Intravenous New Bag/Given 04/21/23 0942)  ondansetron (ZOFRAN) injection 4 mg (4 mg Intravenous Given 04/21/23 1610)    ED Course/ Medical Decision Making/ A&P    Patient seen and examined. History obtained directly from patient.   Labs/EKG: Ordered CBC, CMP, lipase, UA, pregnancy ordered by nursing triage protocol.  Imaging: None ordered  Medications/Fluids: Ordered: IV fluid bolus, IV Zofran.   Most recent vital signs reviewed and are as follows: BP (!) 140/103   Pulse (!) 132   Temp 97.7 F (36.5 C) (Oral)   Resp 16   Ht 5\' 7"  (1.702 m)   Wt 97.5 kg   SpO2 100%   BMI 33.67 kg/m   Initial impression: Tachycardia in setting of nausea, vomiting, recent diarrhea, reassuring abdominal exam.  10:54 AM Reassessment performed. Patient appears stable, tolerating apple juice.  She states that she is feeling better.  Labs personally reviewed and interpreted including: CBC unremarkable; CMP unremarkable; lipase normal; UA with mild ketones otherwise unremarkable; pregnancy negative.  Reviewed pertinent lab work and imaging with patient at bedside. Questions answered.   Patient also had questions about sores that she had on her labia.  This is why she was recently started on Valtrex.  Patient and family at bedside showed me recent results for HSV-1 and HSV-2 antibodies.  I explained what these meant.  She asked me to look at the area.  She has 2 very small vesicles on  the right labia.  I advised that the sores could be herpetic in nature, however would need to also consider other viruses or potentially HPV.  I recommended that she try to take the Valtrex course and see if the sores improved.  If they are persistent, I recommend that she follow-up with her OB/GYN for further evaluation.  Most current vital signs reviewed and are as follows: BP 130/77   Pulse 76   Temp 97.7 F (36.5 C) (Oral)   Resp 16  Ht 5\' 7"  (1.702 m)   Wt 97.5 kg   SpO2 100%   BMI 33.67 kg/m   Plan: Discharge to home.   Prescriptions written for: Zofran  Other home care instructions discussed: Maintain good hydration, bland diet, slow advancement.  ED return instructions discussed: The patient was urged to return to the Emergency Department immediately with worsening of current symptoms, worsening abdominal pain, persistent vomiting, blood noted in stools, fever, or any other concerns. The patient verbalized understanding.   Follow-up instructions discussed: Patient encouraged to follow-up with their PCP in 3 days.                             Medical Decision Making Amount and/or Complexity of Data Reviewed Labs: ordered.  Risk Prescription drug management.   For this patient's complaint of abdominal pain, the following conditions were considered on the differential diagnosis: gastritis/PUD, enteritis/duodenitis, appendicitis, cholelithiasis/cholecystitis, cholangitis, pancreatitis, ruptured viscus, colitis, diverticulitis, small/large bowel obstruction, proctitis, cystitis, pyelonephritis, ureteral colic, aortic dissection, aortic aneurysm. In women, ectopic pregnancy, pelvic inflammatory disease, ovarian cysts, and tubo-ovarian abscess were also considered. Atypical chest etiologies were also considered including ACS, PE, and pneumonia.  Lab workup is very reassuring and symptoms are improved with IV fluids and Zofran.  Patient now tolerating fluids.  No residual  abdominal tenderness on reexam.  Will treat symptomatically at this time.  The patient's vital signs, pertinent lab work and imaging were reviewed and interpreted as discussed in the ED course. Hospitalization was considered for further testing, treatments, or serial exams/observation. However as patient is well-appearing, has a stable exam, and reassuring studies today, I do not feel that they warrant admission at this time. This plan was discussed with the patient who verbalizes agreement and comfort with this plan and seems reliable and able to return to the Emergency Department with worsening or changing symptoms.          Final Clinical Impression(s) / ED Diagnoses Final diagnoses:  Nausea vomiting and diarrhea    Rx / DC Orders ED Discharge Orders          Ordered    ondansetron (ZOFRAN-ODT) 4 MG disintegrating tablet  Every 8 hours PRN        04/21/23 1053              Renne Crigler, PA-C 04/21/23 1057    Arby Barrette, MD 04/22/23 1600

## 2023-05-19 DIAGNOSIS — N898 Other specified noninflammatory disorders of vagina: Secondary | ICD-10-CM | POA: Diagnosis not present

## 2023-12-10 ENCOUNTER — Ambulatory Visit: Payer: Medicaid Other | Admitting: Obstetrics & Gynecology

## 2023-12-10 ENCOUNTER — Encounter: Payer: Self-pay | Admitting: Obstetrics & Gynecology

## 2023-12-10 ENCOUNTER — Other Ambulatory Visit (HOSPITAL_COMMUNITY)
Admission: RE | Admit: 2023-12-10 | Discharge: 2023-12-10 | Disposition: A | Discharge status: Home / Self Care | Payer: Medicaid Other | Source: Ambulatory Visit | Attending: Obstetrics & Gynecology | Admitting: Obstetrics & Gynecology

## 2023-12-10 VITALS — BP 122/81 | HR 85 | Wt 226.0 lb

## 2023-12-10 DIAGNOSIS — Z30432 Encounter for removal of intrauterine contraceptive device: Secondary | ICD-10-CM

## 2023-12-10 DIAGNOSIS — Z01419 Encounter for gynecological examination (general) (routine) without abnormal findings: Secondary | ICD-10-CM | POA: Diagnosis not present

## 2023-12-10 DIAGNOSIS — R87612 Low grade squamous intraepithelial lesion on cytologic smear of cervix (LGSIL): Secondary | ICD-10-CM | POA: Insufficient documentation

## 2023-12-10 NOTE — Progress Notes (Signed)
 GYNECOLOGY CLINIC ANNUAL PREVENTATIVE CARE ENCOUNTER NOTE  Subjective:requests IUD removal   Debra Reilly is a 28 y.o. G71P1001 female here for a routine annual gynecologic exam.  Current complaints: nausea and mood.   Denies abnormal vaginal bleeding, discharge, pelvic pain, problems with intercourse or other gynecologic concerns.    Gynecologic History No LMP recorded. (Menstrual status: IUD). Contraception: IUD Last Pap: 2020. Results were: abnormal Last mammogram: n/a.  Obstetric History OB History  Gravida Para Term Preterm AB Living  1 1 1   1   SAB IAB Ectopic Multiple Live Births     0 1    # Outcome Date GA Lbr Len/2nd Weight Sex Type Anes PTL Lv  1 Term 05/17/18 [redacted]w[redacted]d / 00:35 6 lb 9.1 oz (2.98 kg) F Vag-Spont EPI  LIV    Past Medical History:  Diagnosis Date   Medical history non-contributory     History reviewed. No pertinent surgical history.  No current outpatient medications on file prior to visit.   No current facility-administered medications on file prior to visit.    No Known Allergies  Social History   Socioeconomic History   Marital status: Single    Spouse name: Not on file   Number of children: Not on file   Years of education: Not on file   Highest education level: Not on file  Occupational History   Not on file  Tobacco Use   Smoking status: Never   Smokeless tobacco: Never  Vaping Use   Vaping status: Some Days  Substance and Sexual Activity   Alcohol use: Yes    Comment: occasional   Drug use: Yes    Types: Marijuana    Comment: twice a month   Sexual activity: Yes    Birth control/protection: Condom  Other Topics Concern   Not on file  Social History Narrative   Not on file   Social Drivers of Health   Financial Resource Strain: Medium Risk (06/12/2022)   [facility] visits prior to 01/25/2023.    Overall Financial Resource Strain (CARDIA)    Difficulty of Paying Living Expenses: Somewhat hard  Food Insecurity: High Risk (05/16/2023)   [facility]   Hunger Vital Sign    Worried About Running Out of Food in the Last Year: Often true    Ran Out of Food in the Last Year: Often true  Transportation Needs: Not on file (05/16/2023)  Physical Activity: Sufficiently Active (06/12/2022)   [facility] to 01/25/2023[facility] visits prior to 01/25/2023.   Exercise Vital Sign    Days of Exercise per Week: 3 days    Minutes of Exercise per Session: 100 min  Stress: No Stress Concern Present (06/12/2022)   [facility] visits prior to 01/25/2023., [facility] visits prior to 01/25/2023.   [employer] of Occupational Health - Occupational Stress Questionnaire    Feeling of Stress : Only a little  Social Connections: Moderately Isolated (06/12/2022)   [facility] prior to 01/25/2023[facility] visits prior to 01/25/2023.   Social Connection and Isolation Panel [NHANES]    Frequency of Communication with Friends and Family: Three times a week  Frequency of Social Gatherings with Friends and Family: Once a week    Attends Religious Services: 1 to 4 times per year    Active Member of [employer] or Organizations: No    Attends Banker Meetings: Never    Marital Status: Separated  Intimate Partner Violence: Not on file    Family History  Problem Relation Age of Onset   Diabetes Maternal Aunt    Hypertension Paternal Uncle     The following portions of the patient's history were reviewed and updated as appropriate: allergies, current medications, past family history, past medical history, past  social history, past surgical history and problem list.  Review of Systems Gastrointestinal: positive for nausea   Objective:  BP 122/81   Pulse 85   Wt 226 lb (102.5 kg)   BMI 35.40 kg/m  CONSTITUTIONAL: Well-developed, well-nourished female in no acute distress.  HENT:  Normocephalic, atraumatic, External right and left ear normal. Oropharynx is clear and moist EYES: Conjunctivae and EOM are normal. Pupils are equal, round, and reactive to light. No scleral icterus.  NECK: Normal range of motion, supple, no masses.  Normal thyroid.  SKIN: Skin is warm and dry. No rash noted. Not diaphoretic. No erythema. No pallor. NEUROLGIC: Alert and oriented to person, place, and time. Normal reflexes, muscle tone coordination. No cranial nerve deficit noted. PSYCHIATRIC: Normal mood and affect. Normal behavior. Normal judgment and thought content. CARDIOVASCULAR: Normal heart rate noted, regular rhythm RESPIRATORY:Effort and breath sounds normal, no problems with respiration noted. ABDOMEN: Soft, normal bowel sounds, no distention noted.  No tenderness, rebound or guarding.  PELVIC: Normal appearing external genitalia; normal appearing vaginal mucosa and cervix.  No abnormal discharge noted.  Pap smear obtained.  Normal uterine size, no other palpable masses, no uterine or adnexal tenderness. MUSCULOSKELETAL: Normal range of motion. No tenderness.  No cyanosis, clubbing, or edema.  Assessment:  Annual gynecologic examination with pap smear  IUD removal Plan:  Will follow up results of pap smear and manage accordingly. Routine preventative health maintenance measures emphasized. Please refer to After Visit Summary for other counseling recommendations.    Onnie Bilis, MD Attending Obstetrician & Gynecologist Center for [employer], [facility]

## 2023-12-10 NOTE — Progress Notes (Signed)
    GYNECOLOGY OFFICE PROCEDURE NOTE  Debra Reilly is a 28 y.o. G1P1001 here for IUD removal. No GYN concerns.   IUD Removal  Patient identified, informed consent performed, consent signed.   Chaperone present.  Patient was placed in the dorsal lithotomy position, normal external genitalia was noted.  A speculum was placed in the patient's vagina, normal discharge was noted, no lesions. The cervix was visualized, no lesions, no abnormal discharge.  The strings of the IUD were grasped and pulled using ring forceps. The IUD was removed in its entirety. Patient tolerated the procedure well.    Patient will not use contraception and she was told to avoid teratogens, take PNV and folic acid.  Routine preventative health maintenance measures emphasized.   Tresia Fruit, MD Obstetrician & Gynecologist, [facility] for [facility], [facility] Patient ID: Debra Reilly, female   DOB: 11-22-1996, 28 y.o.   MRN: [id number]

## 2023-12-10 NOTE — Progress Notes (Signed)
 Pt is in office for IUD removal. Pt has had in place since 2019- Mirena .  Pt does not want any other BC at this time.

## 2023-12-11 LAB — CERVICOVAGINAL ANCILLARY ONLY
Bacterial Vaginitis (gardnerella): POSITIVE — AB
Candida Glabrata: NEGATIVE
Candida Vaginitis: NEGATIVE
Chlamydia: NEGATIVE
Comment: NEGATIVE
Comment: NEGATIVE
Comment: NEGATIVE
Comment: NEGATIVE
Comment: NEGATIVE
Comment: NORMAL
Neisseria Gonorrhea: NEGATIVE
Trichomonas: NEGATIVE

## 2023-12-11 LAB — HEPATITIS C ANTIBODY: Hep C Virus Ab: NONREACTIVE

## 2023-12-11 LAB — HIV ANTIBODY (ROUTINE TESTING W REFLEX): HIV Screen 4th Generation wRfx: NONREACTIVE

## 2023-12-11 LAB — RPR: RPR Ser Ql: NONREACTIVE

## 2023-12-11 LAB — HEPATITIS B SURFACE ANTIGEN: Hepatitis B Surface Ag: NEGATIVE

## 2023-12-16 LAB — CYTOLOGY - PAP
Comment: NEGATIVE
Diagnosis: UNDETERMINED — AB
High risk HPV: NEGATIVE

## 2023-12-18 ENCOUNTER — Telehealth: Payer: Self-pay

## 2023-12-18 MED ORDER — METRONIDAZOLE 0.75 % VA GEL: 1.0000 | Freq: Every day | VAGINAL | 0 refills | Status: AC

## 2023-12-18 NOTE — Telephone Encounter (Signed)
Returned call and pt is requesting rx for +BV on 1/15, also has questions about pap results. Advised that I would message provider for review. Sent rx per protocol.

## 2023-12-19 ENCOUNTER — Encounter: Payer: Self-pay | Admitting: Obstetrics & Gynecology

## 2024-03-10 ENCOUNTER — Emergency Department (HOSPITAL_BASED_OUTPATIENT_CLINIC_OR_DEPARTMENT_OTHER)
Admission: EM | Admit: 2024-03-10 | Discharge: 2024-03-10 | Disposition: A | Discharge status: Home / Self Care | Attending: Emergency Medicine | Admitting: Emergency Medicine

## 2024-03-10 ENCOUNTER — Other Ambulatory Visit: Payer: Self-pay

## 2024-03-10 ENCOUNTER — Encounter (HOSPITAL_BASED_OUTPATIENT_CLINIC_OR_DEPARTMENT_OTHER): Payer: Self-pay

## 2024-03-10 DIAGNOSIS — R519 Headache, unspecified: Secondary | ICD-10-CM | POA: Insufficient documentation

## 2024-03-10 LAB — PREGNANCY, URINE: Preg Test, Ur: NEGATIVE

## 2024-03-10 MED ORDER — SUMATRIPTAN SUCCINATE 100 MG PO TABS: 100.0000 mg | ORAL_TABLET | ORAL | 0 refills | Status: AC | PRN

## 2024-03-10 MED ORDER — METOCLOPRAMIDE HCL 10 MG PO TABS
10.0000 mg | ORAL_TABLET | Freq: Once | ORAL | Status: AC
Start: 2024-03-10 — End: 2024-03-10
  Administered 2024-03-10: 10 mg via ORAL
  Filled 2024-03-10: qty 1

## 2024-03-10 MED ORDER — DIPHENHYDRAMINE HCL 25 MG PO CAPS
25.0000 mg | ORAL_CAPSULE | Freq: Once | ORAL | Status: AC
  Administered 2024-03-10: 25 mg via ORAL
  Filled 2024-03-10: qty 1

## 2024-03-10 MED ORDER — KETOROLAC TROMETHAMINE 30 MG/ML IJ SOLN
30.0000 mg | Freq: Once | INTRAMUSCULAR | Status: AC
Start: 2024-03-10 — End: 2024-03-10
  Administered 2024-03-10: 30 mg via INTRAMUSCULAR
  Filled 2024-03-10: qty 1

## 2024-03-10 NOTE — ED Provider Notes (Signed)
 [place] EMERGENCY DEPARTMENT AT [facility] Provider Note   CSN: [id number] Arrival date & time: 03/10/24  1715     History  Chief Complaint  Patient presents with   Headache    Debra Reilly is a 28 y.o. female.   Headache   28 year old female presents emergency department with complaint of headache.  Headache present for the past couple of days.  History of migraines and states the distribution of this headache feels similar but this headache feels slightly worse.  Does report increased pain with bright lights as well as sounds.  Took Excedrin at home without improvement of symptoms which typically treats her headaches prompted visit to the emergency department.  Denies any weakness/sensory deficits in lower extremities, facial droop, gait abnormalities.  Does report blurry vision when looking at screens sometimes appears double.  States that she has had these symptoms accompanying more intense migraines in the past.  Past medical history significant for migraine  Home Medications Prior to Admission medications   Medication Sig Start Date End Date Taking? Authorizing Provider  SUMAtriptan (IMITREX) 100 MG tablet Take 1 tablet (100 mg total) by mouth every 2 (two) hours as needed for migraine. May repeat in 2 hours if headache persists or recurs. Do not exceed two doses in 24 hours 03/10/24  Yes Peter Garter, PA      Allergies    Patient has no known allergies.    Review of Systems   Review of Systems  Neurological:  Positive for headaches.  All other systems reviewed and are negative.   Physical Exam Updated Vital Signs BP (!) 122/90 (BP Location: Right Arm)   Pulse 90   Temp 98.2 F (36.8 C) (Oral)   Resp 16   Ht 5\' 7"  (1.702 m)   Wt 102.1 kg   SpO2 98%   BMI 35.24 kg/m  Physical Exam Vitals and nursing note reviewed.  Constitutional:      General: She is not in acute distress.    Appearance: She is well-developed.  HENT:     Head:  Normocephalic and atraumatic.  Eyes:     Conjunctiva/sclera: Conjunctivae normal.  Cardiovascular:     Rate and Rhythm: Normal rate and regular rhythm.     Heart sounds: No murmur heard. Pulmonary:     Effort: Pulmonary effort is normal. No respiratory distress.     Breath sounds: Normal breath sounds.  Abdominal:     Palpations: Abdomen is soft.     Tenderness: There is no abdominal tenderness.  Musculoskeletal:        General: No swelling.     Cervical back: Normal range of motion and neck supple. No rigidity or tenderness.  Lymphadenopathy:     Cervical: No cervical adenopathy.  Skin:    General: Skin is warm and dry.     Capillary Refill: Capillary refill takes less than 2 seconds.  Neurological:     Mental Status: She is alert.  Psychiatric:        Mood and Affect: Mood normal.     ED Results / Procedures / Treatments   Labs (all labs ordered are listed, but only abnormal results are displayed) Labs Reviewed  PREGNANCY, URINE    EKG None  Radiology No results found.  Procedures Procedures    Medications Ordered in ED Medications  metoCLOPramide (REGLAN) tablet 10 mg (10 mg Oral Given 03/10/24 2017)  diphenhydrAMINE (BENADRYL) capsule 25 mg (25 mg Oral Given 03/10/24 2017)  ketorolac (TORADOL) 30  MG/ML injection 30 mg (30 mg Intramuscular Given 03/10/24 2034)    ED Course/ Medical Decision Making/ A&P                                 Medical Decision Making Amount and/or Complexity of Data Reviewed Labs: ordered.  Risk Prescription drug management.   This patient presents to the ED for concern of headache, this involves an extensive number of treatment options, and is a complaint that carries with it a high risk of complications and morbidity.  The differential diagnosis includes migraine/tension/cluster headache, CVA, ceruminous thrombosis, meningitis/encephalitis, pseudotumor cerebri, giant cell arteritis, other   Co morbidities that complicate the  patient evaluation  See HPI   Additional history obtained:  Additional history obtained from EMR External records from outside source obtained and reviewed including hospital records   Lab Tests:  Urine pregnancy negative   Imaging Studies ordered:  N/a   Cardiac Monitoring: / EKG:  The patient was maintained on a cardiac monitor.  I personally viewed and interpreted the cardiac monitored which showed an underlying rhythm of: sinus rhythm   Consultations Obtained:  N/a   Problem List / ED Course / Critical interventions / Medication management  Headache I ordered medication including Reglan, Benadryl, Toradol  Reevaluation of the patient after these medicines showed that the patient improved I have reviewed the patients home medicines and have made adjustments as needed   Social Determinants of Health:  Denies tobacco, licit drug use   Test / Admission - Considered:  Headache Vitals signs significant for hypertension blood pressure 142/98. Otherwise within normal range and stable throughout visit. Laboratory/imaging studies significant for: See above 28 year old female presents emergency department with complaint of headache.  Headache present for the past couple of days.  History of migraines and states the distribution of this headache feels similar but this headache feels slightly worse.  Does report increased pain with bright lights as well as sounds.  Took Excedrin at home without improvement of symptoms which typically treats her headaches prompted visit to the emergency department.  Denies any weakness/sensory deficits in lower extremities, facial droop, gait abnormalities.  Does report blurry vision when looking at screens sometimes appears double.  States that she has had these symptoms accompanying more intense migraines in the past. On exam, nonfocal neuroexam.  Shared decision making conversation was had with patient regarding holding on CT imaging given  headache somewhat similar to other headaches she has had in the past along with visual symptoms as above and she agreed.  Treated with migraine cocktail and did note significant improvement reaching near resolution.  Declined CT imaging thereafter.  No clinical evidence of meningismus; low suspicion for meningitis.  Suspect migraine type headache.  Will send home with Imitrex to take as needed for any migraine refractory to OTC medications in the outpatient setting.  Recommend follow-up with PCP in the outpatient setting for reassessment.  Treatment plan discussed with patient and she acknowledged understanding was agreeable to said plan.  Patient will well-appearing, afebrile in no acute distress. Worrisome signs and symptoms were discussed with the patient, and the patient acknowledged understanding to return to the ED if noticed. Patient was stable upon discharge.          Final Clinical Impression(s) / ED Diagnoses Final diagnoses:  Acute nonintractable headache, unspecified headache type    Rx / DC Orders ED Discharge Orders  Ordered    SUMAtriptan (IMITREX) 100 MG tablet  Every 2 hours PRN        03/10/24 2109              Panora Butter, [place] 03/10/24 2118    Onetha Bile, MD 03/11/24 [phone]

## 2024-03-10 NOTE — Discharge Instructions (Signed)
 As discussed, we will send in medicine to use for any future headaches.  Recommend follow-up with your primary care for reassessment.  Please do not hesitate to return if the worrisome signs and symptoms we discussed become apparent.

## 2024-03-10 NOTE — ED Triage Notes (Signed)
 Pt reports migraine starting yesterday AM. Pt reports hx of migraines but is not on any medication. Pt endorses N/V yesterday. Pt reports taking Excedrin w/o improvement.

## 2024-05-25 ENCOUNTER — Encounter (HOSPITAL_BASED_OUTPATIENT_CLINIC_OR_DEPARTMENT_OTHER): Payer: Self-pay | Admitting: Emergency Medicine

## 2024-05-25 ENCOUNTER — Emergency Department (HOSPITAL_BASED_OUTPATIENT_CLINIC_OR_DEPARTMENT_OTHER)
Admission: EM | Admit: 2024-05-25 | Discharge: 2024-05-25 | Disposition: A | Discharge status: Home / Self Care | Attending: Emergency Medicine | Admitting: Emergency Medicine

## 2024-05-25 ENCOUNTER — Other Ambulatory Visit: Payer: Self-pay

## 2024-05-25 ENCOUNTER — Emergency Department (HOSPITAL_BASED_OUTPATIENT_CLINIC_OR_DEPARTMENT_OTHER): Admitting: Radiology

## 2024-05-25 DIAGNOSIS — M7989 Other specified soft tissue disorders: Secondary | ICD-10-CM | POA: Diagnosis not present

## 2024-05-25 DIAGNOSIS — M79644 Pain in right finger(s): Secondary | ICD-10-CM | POA: Diagnosis not present

## 2024-05-25 NOTE — ED Triage Notes (Signed)
 Saturday ( 10 days ago) Right thumb pain and swelling Was scooting back wards and bend thumb, has worn brace but still has pain and swelling
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
## 2024-05-25 NOTE — ED Notes (Signed)
 Dc instructions reviewed with patient. Patient voiced understanding. Dc with belongings.

## 2024-05-25 NOTE — Discharge Instructions (Addendum)
 It was a pleasure taking care of you today.  Based on your history, physical exam, and imaging I feel you are safe for discharge.  Your x-rays today were negative for fracture.  However this does not rule out the possibility of soft tissue, ligament, tendon injury.  Please continue to wear your thumb splint at home as needed for comfort, please also continue rest, ice, compression, and elevation.  Take over-the-counter Tylenol  and ibuprofen  as needed for pain/discomfort, please pay attention to maximum daily doses on the medicine bottles. You have been given an orthopedic follow-up, please call as soon as possible to make an appointment for ongoing diagnosis and treatment.  Please return to the emergency department or seek further medical care if you experience any of the following symptoms including but not limited to fever, chills, severe pain, weakness, numbness/tingling.

## 2024-06-10 DIAGNOSIS — M79641 Pain in right hand: Secondary | ICD-10-CM | POA: Diagnosis not present

## 2024-07-08 DIAGNOSIS — M79641 Pain in right hand: Secondary | ICD-10-CM | POA: Diagnosis not present

## 2024-08-08 ENCOUNTER — Other Ambulatory Visit: Payer: Self-pay

## 2024-08-08 ENCOUNTER — Emergency Department (HOSPITAL_BASED_OUTPATIENT_CLINIC_OR_DEPARTMENT_OTHER)
Admission: EM | Admit: 2024-08-08 | Discharge: 2024-08-09 | Disposition: A | Discharge status: Home / Self Care | Attending: Emergency Medicine | Admitting: Emergency Medicine

## 2024-08-08 ENCOUNTER — Encounter (HOSPITAL_BASED_OUTPATIENT_CLINIC_OR_DEPARTMENT_OTHER): Payer: Self-pay

## 2024-08-08 DIAGNOSIS — R197 Diarrhea, unspecified: Secondary | ICD-10-CM | POA: Insufficient documentation

## 2024-08-08 DIAGNOSIS — K625 Hemorrhage of anus and rectum: Secondary | ICD-10-CM | POA: Diagnosis not present

## 2024-08-08 DIAGNOSIS — K921 Melena: Secondary | ICD-10-CM | POA: Diagnosis not present

## 2024-08-08 LAB — COMPREHENSIVE METABOLIC PANEL WITH GFR
ALT: 9 U/L (ref 0–44)
AST: 18 U/L (ref 15–41)
Albumin: 4.5 g/dL (ref 3.5–5.0)
Alkaline Phosphatase: 51 U/L (ref 38–126)
Anion gap: 13 (ref 5–15)
BUN: 11 mg/dL (ref 6–20)
CO2: 23 mmol/L (ref 22–32)
Calcium: 9.9 mg/dL (ref 8.9–10.3)
Chloride: 102 mmol/L (ref 98–111)
Creatinine, Ser: 0.71 mg/dL (ref 0.44–1.00)
GFR, Estimated: >60 mL/min (ref >60)
Glucose, Bld: 82 mg/dL (ref 70–99)
Potassium: 4 mmol/L (ref 3.5–5.1)
Sodium: 138 mmol/L (ref 135–145)
Total Bilirubin: 0.7 mg/dL (ref 0.0–1.2)
Total Protein: 7.8 g/dL (ref 6.5–8.1)

## 2024-08-08 LAB — CBC
HCT: 40.7 % (ref 36.0–46.0)
Hemoglobin: 13.4 g/dL (ref 12.0–15.0)
MCH: 31.8 pg (ref 26.0–34.0)
MCHC: 32.9 g/dL (ref 30.0–36.0)
MCV: 96.4 fL (ref 80.0–100.0)
Platelets: 250 K/uL (ref 150–400)
RBC: 4.22 MIL/uL (ref 3.87–5.11)
RDW: 12.8 % (ref 11.5–15.5)
WBC: 10.1 K/uL (ref 4.0–10.5)
nRBC: 0 % (ref 0.0–0.2)

## 2024-08-08 LAB — LIPASE, BLOOD: Lipase: 13 U/L (ref 11–51)

## 2024-08-08 LAB — OCCULT BLOOD X 1 CARD TO LAB, STOOL: Fecal Occult Bld: POSITIVE — AB

## 2024-08-08 MED ORDER — SULFAMETHOXAZOLE-TRIMETHOPRIM 800-160 MG PO TABS
1.0000 | ORAL_TABLET | Freq: Once | ORAL | Status: AC
  Administered 2024-08-08: 1 via ORAL
  Filled 2024-08-08: qty 1

## 2024-08-08 MED ORDER — SULFAMETHOXAZOLE-TRIMETHOPRIM 800-160 MG PO TABS: 1.0000 | ORAL_TABLET | Freq: Two times a day (BID) | ORAL | 0 refills | Status: DC

## 2024-08-08 NOTE — Discharge Instructions (Signed)
 You have been given a prescription for an antibiotic for the next 3 days.  This should take care of the infection that is causing the bloody diarrhea.  However, if you develop increased abdominal pain or fever, please return to the emergency department immediately.

## 2024-08-08 NOTE — ED Provider Notes (Signed)
 [place] EMERGENCY DEPARTMENT AT [facility] Provider Note   CSN: [id number] Arrival date & time: 08/08/24  2037     Patient presents with: Rectal Bleeding   Debra Reilly is a 28 y.o. female.   The history is provided by the patient.  Rectal Bleeding  She has no significant past history and comes in because of rectal bleeding today.  She had diarrhea this morning, and then this afternoon and this evening she had 2 episodes of bright red blood per rectum.  There were no clots.  She did have some mild cramping just before a bowel movement but no abdominal pain.  She denies any nausea or vomiting.  She felt a little clammy with the first episode but not the second.  She denies any sick contacts.    Prior to Admission medications   Medication Sig Start Date End Date Taking? Authorizing Provider  SUMAtriptan  (IMITREX ) 100 MG tablet Take 1 tablet (100 mg total) by mouth every 2 (two) hours as needed for migraine. May repeat in 2 hours if headache persists or recurs. Do not exceed two doses in 24 hours 03/10/24   Silver Wonda LABOR, PA    Allergies: Patient has no known allergies.    Review of Systems  Gastrointestinal:  Positive for hematochezia.  All other systems reviewed and are negative.   Updated Vital Signs BP (!) 175/98 (BP Location: Right Arm)   Pulse 65   Temp 97.9 F (36.6 C)   Resp 16   SpO2 100%   Physical Exam Vitals and nursing note reviewed. Exam conducted with a chaperone present.   28 year old female, resting comfortably and in no acute distress. Vital signs are significant for elevated blood pressure. Oxygen saturation is 100%, which is normal. Head is normocephalic and atraumatic. PERRLA, EOMI.  Lungs are clear without rales, wheezes, or rhonchi. Chest is nontender. Heart has regular rate and rhythm without murmur. Abdomen is soft, flat, nontender. Rectal: Normal sphincter tone, small amount of light pink blood present in the rectal vault but no  stool present. Skin is warm and dry without rash. Neurologic: Mental status is normal, cranial nerves are intact, moves all extremities equally.  (all labs ordered are listed, but only abnormal results are displayed) Labs Reviewed  OCCULT BLOOD X 1 CARD TO LAB, STOOL - Abnormal; Notable for the following components:      Result Value   Fecal Occult Bld POSITIVE (*)    All other components within normal limits  LIPASE, BLOOD  COMPREHENSIVE METABOLIC PANEL WITH GFR  CBC    Procedures   Medications Ordered in the ED  sulfamethoxazole -trimethoprim  (BACTRIM  DS) 800-160 MG per tablet 1 tablet (has no administration in time range)                                    Medical Decision Making Amount and/or Complexity of Data Reviewed Labs: ordered.  Risk Prescription drug management.   Rectal bleeding as part of a diarrhea illness.  Suspect bacterial dysentery.  I have reviewed her laboratory results and my interpretation is normal CBC and normal comprehensive metabolic panel.  Stool Hemoccult has been sent.  Stool Hemoccult is positive.  This is most likely Salmonella or Shigella infection.  I have ordered a dose of trimethoprim -sulfamethoxazole  and I am sending her home with a 3-day prescription for same.  Return precautions discussed.     Final diagnoses:  Bloody diarrhea    ED Discharge Orders          Ordered    sulfamethoxazole -trimethoprim  (BACTRIM  DS) 800-160 MG tablet  2 times daily        08/08/24 2351               Raford Lenis, MD 08/08/24 2355

## 2024-08-08 NOTE — ED Triage Notes (Signed)
 Pt c/o shitting out blood, & it's not shit it's just blood.  Advises she woke up this AM & went to restroom, was super sweaty, it was loose, second time it was just food, last two times it was straight blood, kinda bright. States she gets bubbling feeling in stomach before episodes, denies NV, dizziness, hemorrhoids

## 2024-08-09 ENCOUNTER — Telehealth (HOSPITAL_BASED_OUTPATIENT_CLINIC_OR_DEPARTMENT_OTHER): Payer: Self-pay | Admitting: Emergency Medicine

## 2024-10-13 ENCOUNTER — Emergency Department (HOSPITAL_BASED_OUTPATIENT_CLINIC_OR_DEPARTMENT_OTHER): Admission: EM | Admit: 2024-10-13 | Discharge: 2024-10-13 | Discharge status: Against Medical Advice

## 2024-10-13 ENCOUNTER — Encounter (HOSPITAL_BASED_OUTPATIENT_CLINIC_OR_DEPARTMENT_OTHER): Payer: Self-pay | Admitting: Emergency Medicine

## 2024-10-13 ENCOUNTER — Other Ambulatory Visit: Payer: Self-pay

## 2024-10-13 DIAGNOSIS — R519 Headache, unspecified: Secondary | ICD-10-CM | POA: Diagnosis not present

## 2024-10-13 DIAGNOSIS — M545 Low back pain, unspecified: Secondary | ICD-10-CM | POA: Insufficient documentation

## 2024-10-13 DIAGNOSIS — Y92410 Unspecified street and highway as the place of occurrence of the external cause: Secondary | ICD-10-CM | POA: Insufficient documentation

## 2024-10-13 DIAGNOSIS — M542 Cervicalgia: Secondary | ICD-10-CM | POA: Insufficient documentation

## 2024-10-13 MED ORDER — LIDOCAINE 5 % EX PTCH: 1.0000 | MEDICATED_PATCH | CUTANEOUS | 0 refills | Status: AC

## 2024-10-13 MED ORDER — KETOROLAC TROMETHAMINE 15 MG/ML IJ SOLN
15.0000 mg | Freq: Once | INTRAMUSCULAR | Status: AC
  Administered 2024-10-13: 15 mg via INTRAMUSCULAR
  Filled 2024-10-13: qty 1

## 2024-10-13 MED ORDER — METHOCARBAMOL 500 MG PO TABS: 500.0000 mg | ORAL_TABLET | Freq: Two times a day (BID) | ORAL | 0 refills | Status: AC

## 2024-10-13 MED ORDER — LIDOCAINE 5 % EX PTCH
1.0000 | MEDICATED_PATCH | CUTANEOUS | Status: DC
  Administered 2024-10-13: 1 via TRANSDERMAL
  Filled 2024-10-13: qty 1

## 2024-10-13 MED ORDER — NAPROXEN 500 MG PO TABS: 500.0000 mg | ORAL_TABLET | Freq: Two times a day (BID) | ORAL | 0 refills | Status: AC

## 2024-10-13 NOTE — ED Notes (Signed)
 Pt states she was in restroom when called. Pt updated to status
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
## 2024-10-13 NOTE — ED Notes (Signed)
 Toradol  - Pt stated that there was no chance she was preg... Pt aware of the possible side effects if preg and okay with the shot.. Provider cleared.SABRASABRA

## 2024-10-13 NOTE — Discharge Instructions (Signed)
 I would recommend naproxen  every 12 hours make sure you take this medicine for food.  You can also take 1000 mg of Tylenol  every 8 hours.  Try ice heat or lidocaine  patch over area of pain.  Take Robaxin  as needed for muscle spasm.  Return to emergency room with new or worsening symptoms.

## 2024-10-13 NOTE — ED Notes (Signed)
 DC paperwork given and verbally understood.
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
## 2025-01-05 ENCOUNTER — Ambulatory Visit: Payer: Self-pay
